# Patient Record
Sex: Female | Born: 1995 | Race: Black or African American | Hispanic: No | Marital: Single | Smoking: Never smoker
Health system: Southern US, Community
[De-identification: ages and names within clinical notes are randomized; demographics above are authoritative.]

## PROBLEM LIST (undated history)

## (undated) DIAGNOSIS — F419 Anxiety disorder, unspecified: Secondary | ICD-10-CM

## (undated) DIAGNOSIS — J45909 Unspecified asthma, uncomplicated: Secondary | ICD-10-CM

## (undated) DIAGNOSIS — E119 Type 2 diabetes mellitus without complications: Secondary | ICD-10-CM

---
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
## 2016-02-01 ENCOUNTER — Emergency Department (INDEPENDENT_AMBULATORY_CARE_PROVIDER_SITE_OTHER)
Admission: EM | Admit: 2016-02-01 | Discharge: 2016-02-01 | Disposition: A | Discharge status: Home / Self Care | Payer: Medicaid Other | Source: Home / Self Care | Attending: Emergency Medicine | Admitting: Emergency Medicine

## 2016-02-01 ENCOUNTER — Encounter (HOSPITAL_COMMUNITY): Payer: Self-pay | Admitting: *Deleted

## 2016-02-01 DIAGNOSIS — R059 Cough, unspecified: Secondary | ICD-10-CM

## 2016-02-01 DIAGNOSIS — R05 Cough: Secondary | ICD-10-CM

## 2016-02-01 NOTE — ED Notes (Signed)
Pt  Reports     Symptoms  Of  Cough      Congested        Nausea  Stuffy  Nose   With  Onset  Of  Symptoms      X  3  Days        Pt  Reports  Symptoms  Not  releived  By  otc  meds

## 2016-02-01 NOTE — Discharge Instructions (Signed)

## 2016-02-01 NOTE — ED Provider Notes (Signed)
CSN: [id number]     Arrival date & time 02/01/16  1303 History   First MD Initiated Contact with Patient 02/01/16 1439     Chief Complaint  Patient presents with  . URI   (Consider location/radiation/quality/duration/timing/severity/associated sxs/prior Treatment) HPI Cough for the last few days, dry minimally productive. Not sleeping well at night.  No fever at home.  History reviewed. No pertinent past medical history. History reviewed. No pertinent past surgical history. Family History  Problem Relation Age of Onset  . Diabetes Other   . Hypertension Other    Social History  Substance Use Topics  . Smoking status: Never Smoker   . Smokeless tobacco: None  . Alcohol Use: No   OB History    No data available     Review of Systems Cough without fever, diarrhea, chills Allergies  Review of patient's allergies indicates no known allergies.  Home Medications   Prior to Admission medications   Medication Sig Start Date End Date Taking? Authorizing Provider  cyclobenzaprine (FLEXERIL) 10 MG tablet Take 1 tablet (10 mg total) by mouth 2 (two) times daily as needed for muscle spasms. 09/23/15   Chase PicketJaime Pilcher Ward, PA-C  naproxen (NAPROSYN) 500 MG tablet Take 1 tablet (500 mg total) by mouth 2 (two) times daily with a meal. 09/23/15   Chase PicketJaime Pilcher Ward, PA-C   Meds Ordered and Administered this Visit  Medications - No data to display  BP 130/69 mmHg  Pulse 78  Temp(Src) 98.2 F (36.8 C) (Oral)  Resp 16  SpO2 100% No data found.   Physical Exam NURSES NOTES AND VITAL SIGNS REVIEWED. CONSTITUTIONAL: Well developed, well nourished, no acute distress HEENT: normocephalic, atraumatic, right and left TM's are normal EYES: Conjunctiva normal NECK:normal ROM, supple, no adenopathy PULMONARY:No respiratory distress, normal effort, Lungs: CTAb/l, no wheezes, or increased work of breathing CARDIOVASCULAR: RRR, no murmur ABDOMEN: soft, ND, NT, +'ve BS MUSCULOSKELETAL: Normal  ROM of all extremities,  SKIN: warm and dry without rash PSYCHIATRIC: Mood and affect, behavior are normal  ED Course  Procedures (including critical care time)  Labs Review Labs Reviewed - No data to display  Imaging Review No results found.   Visual Acuity Review  Right Eye Distance:   Left Eye Distance:   Bilateral Distance:    Right Eye Near:   Left Eye Near:    Bilateral Near:         MDM   1. Cough      Patient is reassured that there are no issues that require transfer to higher level of care at this time or additional tests. Patient is advised to continue home symptomatic treatment. Patient is advised that if there are new or worsening symptoms to attend the emergency department, contact primary care provider, or return to UC. Instructions of care provided discharged home in stable condition. Return to work/school note provided.   THIS NOTE WAS GENERATED USING A VOICE RECOGNITION SOFTWARE PROGRAM. ALL REASONABLE EFFORTS  WERE MADE TO PROOFREAD THIS DOCUMENT FOR ACCURACY.  I have verbally reviewed the discharge instructions with the patient. A printed AVS was given to the patient.  All questions were answered prior to discharge.      Tharon AquasFrank C Patrick, PA 02/01/16 2210

## 2016-05-11 ENCOUNTER — Encounter (HOSPITAL_COMMUNITY): Payer: Self-pay | Admitting: *Deleted

## 2016-05-11 ENCOUNTER — Emergency Department (HOSPITAL_COMMUNITY)
Admission: EM | Admit: 2016-05-11 | Discharge: 2016-05-11 | Disposition: A | Discharge status: Home / Self Care | Payer: Medicaid Other | Attending: Emergency Medicine | Admitting: Emergency Medicine

## 2016-05-11 DIAGNOSIS — Y929 Unspecified place or not applicable: Secondary | ICD-10-CM | POA: Diagnosis not present

## 2016-05-11 DIAGNOSIS — Y9367 Activity, basketball: Secondary | ICD-10-CM | POA: Insufficient documentation

## 2016-05-11 DIAGNOSIS — M79604 Pain in right leg: Secondary | ICD-10-CM | POA: Diagnosis present

## 2016-05-11 DIAGNOSIS — S76311A Strain of muscle, fascia and tendon of the posterior muscle group at thigh level, right thigh, initial encounter: Secondary | ICD-10-CM | POA: Diagnosis not present

## 2016-05-11 DIAGNOSIS — J45909 Unspecified asthma, uncomplicated: Secondary | ICD-10-CM | POA: Insufficient documentation

## 2016-05-11 DIAGNOSIS — X501XXA Overexertion from prolonged static or awkward postures, initial encounter: Secondary | ICD-10-CM | POA: Insufficient documentation

## 2016-05-11 DIAGNOSIS — Y999 Unspecified external cause status: Secondary | ICD-10-CM | POA: Diagnosis not present

## 2016-05-11 DIAGNOSIS — Z791 Long term (current) use of non-steroidal anti-inflammatories (NSAID): Secondary | ICD-10-CM | POA: Diagnosis not present

## 2016-05-11 HISTORY — DX: Unspecified asthma, uncomplicated: J45.909

## 2016-05-11 NOTE — ED Notes (Signed)
Pt given discharge instructions verbalized understanding of POC to follow up, reasons to return to the ED and medications to take at home for continued pain. Pt denied further questions, discharged with family. Pt escorted via wheelchair without difficulty.

## 2016-05-11 NOTE — ED Notes (Signed)
Pt said she fell while playing basketball yesterday and c/o pain in the back of her right thigh. Took ibuprofen without relief.

## 2016-05-11 NOTE — Discharge Instructions (Signed)
Hamstring Strain A hamstring strain is an injury that occurs when the hamstring muscles are overstretched or overloaded. The hamstring muscles are a group of muscles at the back of the thighs. These muscles are used in straightening the hips, bending the knees, and pulling back the legs. This type of injury is often called a pulled hamstring muscle. The severity of a muscle strain is rated in degrees. First-degree strains have the least amount of muscle fiber tearing and pain. Second-degree and third-degree strains have increasingly more tearing and pain. CAUSES Hamstring strains occur when a sudden, violent force is placed on these muscles and stretches them too far. This often occurs during activities that involve running, jumping, kicking, or weight lifting. RISK FACTORS Hamstring strains are especially common in athletes. Other things that can increase your risk for this injury include:  Having low strength, endurance, or flexibility of the hamstring muscles.  Performing high-impact physical activity.  Having poor physical fitness.  Having a previous leg injury.  Having fatigued muscles.  Older age. SIGNS AND SYMPTOMS  Pain in the back of the thigh.  Bruising.  Swelling.  Muscle spasm.  Difficulty using the muscle because of pain or lack of normal function. For severe strains, you may have a popping or snapping feeling when the injury occurs. DIAGNOSIS Your health care provider will perform a physical exam and ask about your medical history.  TREATMENT Often, the best treatment for a hamstring strain is protecting, resting, icing, applying compression, and elevating the injured area. This is referred to as the PRICE method of treatment. Your health care provider may also recommend medicines to help reduce pain or inflammation. HOME CARE INSTRUCTIONS  Use the PRICE method of treatment to promote muscle healing during the first 2-3 days after your injury. The PRICE method  involves:  P--Protecting the muscle from being injured again.  R--Restricting your activity and resting the injured body part.  I--Icing your injury. To do this, put ice in a plastic bag. Place a towel between your skin and the bag. Then, apply the ice and leave it on for 20 minutes, 2-3 times per day. After the third day, switch to moist heat packs.  C--Applying compression to the injured area with an elastic bandage. Be careful not to wrap it too tightly. That may interfere with blood circulation or may increase swelling.  E--Elevating the injured body part above the level of your heart as often as you can. You can do this by putting a pillow under your thigh when you sit or lie down.  Take medicines only as directed by your health care provider.  Begin exercising or stretching as directed by your health care provider.  Do not return to full activity level until your health care provider approves.  Keep all follow-up visits as directed by your health care provider. This is important. SEEK MEDICAL CARE IF:  You have increasing pain or swelling in the injured area.  You have numbness, tingling, or a significant loss of strength in the injured area.  Your foot or your toes become cold or turn blue.   This information is not intended to replace advice given to you by your health care provider. Make sure you discuss any questions you have with your health care provider.   Apply ice to affected area. Take ibuprofen every 4-6 hours. Rest your muscle as much as possible, avoid strenuous activity until your hamstring heals. Follow up with your primary care provider if you experience severe worsening  of your symptoms, increased pain or swelling, bulging of your muscle, blue discoloration of your extremity, fevers, chest pain or shortness of breath.

## 2016-05-13 NOTE — ED Provider Notes (Signed)
CSN: [id number]     Arrival date & time 05/11/16  2120 History   First MD Initiated Contact with Patient 05/11/16 2221     Chief Complaint  Patient presents with  . Leg Pain     (Consider location/radiation/quality/duration/timing/severity/associated sxs/prior Treatment) HPI  Barbara Cabrera is an 20 y.o F with no significant pmhx who presents to the ED today c.o R leg pain. Pt states that she was playing basketball with her brother yesterday and he fell on her causing her to also fall down. Pt states that her right leg twisted and she felt pain in her right hamstring. Pain is worsened with walking and movement. Pt has been able to ambulate without difficulty. Pt has tried icing it and taking ibuprofen which provides some relief but the pain is not completely gone. Pt states her high school trainer told her she may have strained her hamstring. She denies any swelling, bulging or discoloration of her leg. She is not on OCPs.  Past Medical History  Diagnosis Date  . Asthma    History reviewed. No pertinent past surgical history. Family History  Problem Relation Age of Onset  . Diabetes Other   . Hypertension Other    Social History  Substance Use Topics  . Smoking status: Never Smoker   . Smokeless tobacco: None  . Alcohol Use: No   OB History    No data available     Review of Systems  All other systems reviewed and are negative.     Allergies  Review of patient's allergies indicates no known allergies.  Home Medications   Prior to Admission medications   Medication Sig Start Date End Date Taking? Authorizing Provider  cyclobenzaprine (FLEXERIL) 10 MG tablet Take 1 tablet (10 mg total) by mouth 2 (two) times daily as needed for muscle spasms. 09/23/15   Chase PicketJaime Pilcher Ward, PA-C  naproxen (NAPROSYN) 500 MG tablet Take 1 tablet (500 mg total) by mouth 2 (two) times daily with a meal. 09/23/15   Jaime Pilcher Ward, PA-C   BP 120/72 mmHg  Pulse 73  Temp(Src) 98.9 F (37.2  C) (Oral)  Resp 16  Ht 5\' 9"  (1.753 m)  Wt 86.183 kg  BMI 28.05 kg/m2  SpO2 100%  LMP 04/21/2016 Physical Exam  Constitutional: She is oriented to person, place, and time. She appears well-developed and well-nourished. No distress.  HENT:  Head: Normocephalic and atraumatic.  Eyes: Conjunctivae are normal. Right eye exhibits no discharge. Left eye exhibits no discharge. No scleral icterus.  Cardiovascular: Normal rate.   Pulmonary/Chest: Effort normal.  Musculoskeletal:  Mild TTP of right hamstring. No bulging of muscle with flexion. No swelling.   Neurological: She is alert and oriented to person, place, and time. Coordination normal.  Skin: Skin is warm and dry. No rash noted. She is not diaphoretic. No erythema. No pallor.  Psychiatric: She has a normal mood and affect. Her behavior is normal.  Nursing note and vitals reviewed.   ED Course  Procedures (including critical care time) Labs Review Labs Reviewed - No data to display  Imaging Review No results found. I have personally reviewed and evaluated these images and lab results as part of my medical decision-making.   EKG Interpretation None      MDM   Final diagnoses:  Hamstring strain, right, initial encounter    Pt presents with mild pain in right hamstring after playing basketball yesterday. No sign of muscle tear or rupture. Likely muscle strain. Pt ambulatory without  difficulty. Recommend RICE precautions, NSAIDs. Follow up with PCP. Discussed treatment plan with pt and parent who are agreeable. Return precautions outlined in patient discharge instructions.      Lester KinsmanSamantha Tripp GormanDowless, PA-C 05/13/16 1530  Marily MemosJason Mesner, MD 05/16/16 [phone]

## 2016-08-23 ENCOUNTER — Encounter (HOSPITAL_COMMUNITY): Payer: Self-pay | Admitting: Family Medicine

## 2016-08-23 ENCOUNTER — Ambulatory Visit (HOSPITAL_COMMUNITY)
Admission: EM | Admit: 2016-08-23 | Discharge: 2016-08-23 | Disposition: A | Discharge status: Home / Self Care | Payer: Medicaid Other | Attending: Family Medicine | Admitting: Family Medicine

## 2016-08-23 DIAGNOSIS — W57XXXA Bitten or stung by nonvenomous insect and other nonvenomous arthropods, initial encounter: Secondary | ICD-10-CM

## 2016-08-23 DIAGNOSIS — L989 Disorder of the skin and subcutaneous tissue, unspecified: Secondary | ICD-10-CM

## 2016-08-23 NOTE — ED Provider Notes (Signed)
CSN: [id number]     Arrival date & time 08/23/16  1925 History   First MD Initiated Contact with Patient 08/23/16 2046     Chief Complaint  Patient presents with  . Insect Bite   (Consider location/radiation/quality/duration/timing/severity/associated sxs/prior Treatment) 20 year old female concerned about 2 lesions to the left forearm believed to be insect bites occurring 2 days ago. She has had no additional lesions since then. They are itchy. Denies systemic symptoms or swelling. Denies rash.      Past Medical History:  Diagnosis Date  . Asthma    History reviewed. No pertinent surgical history. Family History  Problem Relation Age of Onset  . Diabetes Other   . Hypertension Other    Social History  Substance Use Topics  . Smoking status: Never Smoker  . Smokeless tobacco: Never Used  . Alcohol use No   OB History    No data available     Review of Systems  HENT: Negative.   Respiratory: Negative.   Gastrointestinal: Negative.   Skin:       As per history of present illness  Neurological: Negative.   All other systems reviewed and are negative.   Allergies  Review of patient's allergies indicates no known allergies.  Home Medications   Prior to Admission medications   Medication Sig Start Date End Date Taking? Authorizing Provider  cyclobenzaprine (FLEXERIL) 10 MG tablet Take 1 tablet (10 mg total) by mouth 2 (two) times daily as needed for muscle spasms. 09/23/15   Chase PicketJaime Pilcher Ward, PA-C  naproxen (NAPROSYN) 500 MG tablet Take 1 tablet (500 mg total) by mouth 2 (two) times daily with a meal. 09/23/15   Chase PicketJaime Pilcher Ward, PA-C   Meds Ordered and Administered this Visit  Medications - No data to display  BP 119/60   Pulse 72   Temp 98.2 F (36.8 C)   Resp 18   SpO2 100%  No data found.   Physical Exam  Constitutional: She is oriented to person, place, and time. She appears well-developed and well-nourished. No distress.  HENT:  Nose: Nose  normal.  Mouth/Throat: Oropharynx is clear and moist.  Eyes: EOM are normal.  Neck: Normal range of motion. Neck supple.  Cardiovascular: Normal rate.   Pulmonary/Chest: Effort normal. No respiratory distress.  Musculoskeletal: She exhibits no edema.  Neurological: She is alert and oriented to person, place, and time. She exhibits normal muscle tone.  Skin: Skin is warm and dry.  There are 2 annular erythematous lesions, smooth and slightly raised to the left forearm. One to the distal forearm flexor surface the other to the proximal forearm older aspect. Both are the same size and appeared identical. No bleeding or drainage. No red streaks. No cellulitis. No other lesions.  Psychiatric: She has a normal mood and affect.  Nursing note and vitals reviewed.   Urgent Care Course   Clinical Course    Procedures (including critical care time)  Labs Review Labs Reviewed - No data to display  Imaging Review No results found.   Visual Acuity Review  Right Eye Distance:   Left Eye Distance:   Bilateral Distance:    Right Eye Near:   Left Eye Near:    Bilateral Near:         MDM   1. Insect bite, initial encounter    Discussed with patient this possibly may be bugs that was just 2 lesions and no morning the past 2 days likely not. Apply 1% hydrocortisone cream  and Benadryl cream to the lesions as directed for itching. 4 development of intense itching also use ice locally.     Hayden Rasmussen, NP 08/23/16 [phone]

## 2016-08-23 NOTE — ED Triage Notes (Signed)
Pt here for multiple insect bites to left FA. Denies pain. sts sore.

## 2016-08-23 NOTE — Discharge Instructions (Signed)
Discussed with patient this possibly may be bugs that was just 2 lesions and no morning the past 2 days likely not. Apply 1% hydrocortisone cream and Benadryl cream to the lesions as directed for itching. 4 development of intense itching also use ice locally.

## 2017-05-10 ENCOUNTER — Encounter (HOSPITAL_COMMUNITY): Payer: Self-pay | Admitting: Emergency Medicine

## 2017-05-10 ENCOUNTER — Ambulatory Visit (HOSPITAL_COMMUNITY)
Admission: EM | Admit: 2017-05-10 | Discharge: 2017-05-10 | Disposition: A | Discharge status: Home / Self Care | Payer: Medicaid Other | Attending: Family Medicine | Admitting: Family Medicine

## 2017-05-10 DIAGNOSIS — J4521 Mild intermittent asthma with (acute) exacerbation: Secondary | ICD-10-CM

## 2017-05-10 DIAGNOSIS — R0602 Shortness of breath: Secondary | ICD-10-CM

## 2017-05-10 DIAGNOSIS — R062 Wheezing: Secondary | ICD-10-CM | POA: Diagnosis not present

## 2017-05-10 MED ORDER — ALBUTEROL SULFATE HFA 108 (90 BASE) MCG/ACT IN AERS: 1.0000 | INHALATION_SPRAY | Freq: Four times a day (QID) | RESPIRATORY_TRACT | 0 refills | Status: DC | PRN

## 2017-05-10 MED ORDER — ALBUTEROL SULFATE (2.5 MG/3ML) 0.083% IN NEBU
INHALATION_SOLUTION | RESPIRATORY_TRACT | Status: AC
  Filled 2017-05-10: qty 3

## 2017-05-10 MED ORDER — ALBUTEROL SULFATE (2.5 MG/3ML) 0.083% IN NEBU
2.5000 mg | INHALATION_SOLUTION | Freq: Once | RESPIRATORY_TRACT | Status: AC
  Administered 2017-05-10: 2.5 mg via RESPIRATORY_TRACT

## 2017-05-10 MED ORDER — PREDNISONE 10 MG (21) PO TBPK: ORAL_TABLET | ORAL | 0 refills | Status: DC

## 2017-05-10 MED ORDER — SODIUM CHLORIDE 0.9 % IN NEBU
INHALATION_SOLUTION | RESPIRATORY_TRACT | Status: AC
  Filled 2017-05-10: qty 3

## 2017-05-10 NOTE — ED Provider Notes (Signed)
CSN: [id number]     Arrival date & time 05/10/17  1851 History   None    Chief Complaint  Patient presents with  . Shortness of Breath   (Consider location/radiation/quality/duration/timing/severity/associated sxs/prior Treatment) Patient c/o asthma exacerbation, SOB, and wheezing.   The history is provided by the patient.  Shortness of Breath  Severity:  Moderate Onset quality:  Sudden Duration:  3 days Timing:  Constant Progression:  Worsening Chronicity:  New Relieved by:  Nothing Worsened by:  Nothing Ineffective treatments:  None tried   Past Medical History:  Diagnosis Date  . Asthma    History reviewed. No pertinent surgical history. Family History  Problem Relation Age of Onset  . Diabetes Other   . Hypertension Other    Social History  Substance Use Topics  . Smoking status: Never Smoker  . Smokeless tobacco: Never Used  . Alcohol use No   OB History    No data available     Review of Systems  Constitutional: Negative.   HENT: Negative.   Eyes: Negative.   Respiratory: Positive for shortness of breath.   Cardiovascular: Negative.   Gastrointestinal: Negative.   Endocrine: Negative.   Genitourinary: Negative.   Musculoskeletal: Negative.   Allergic/Immunologic: Negative.   Neurological: Negative.   Hematological: Negative.   Psychiatric/Behavioral: Negative.     Allergies  Patient has no known allergies.  Home Medications   Prior to Admission medications   Medication Sig Start Date End Date Taking? Authorizing Provider  albuterol (PROVENTIL HFA;VENTOLIN HFA) 108 (90 Base) MCG/ACT inhaler Inhale into the lungs every 6 (six) hours as needed for wheezing or shortness of breath.   Yes [provider]  albuterol (PROVENTIL HFA;VENTOLIN HFA) 108 (90 Base) MCG/ACT inhaler Inhale 1-2 puffs into the lungs every 6 (six) hours as needed for wheezing or shortness of breath. 05/10/17   Deatra Canter, FNP  predniSONE (STERAPRED UNI-PAK 21  TAB) 10 MG (21) TBPK tablet Take 6-5-4-3-2-1 po qd 05/10/17   Deatra Canter, FNP   Meds Ordered and Administered this Visit   Medications  albuterol (PROVENTIL) (2.5 MG/3ML) 0.083% nebulizer solution 2.5 mg (2.5 mg Nebulization Given 05/10/17 1931)    BP 140/87 (BP Location: Right Arm)   Pulse 84   Temp 98.3 F (36.8 C) (Oral)   Resp 18   SpO2 100%  No data found.   Physical Exam  Constitutional: She appears well-developed and well-nourished.  HENT:  Head: Normocephalic and atraumatic.  Right Ear: External ear normal.  Left Ear: External ear normal.  Mouth/Throat: Oropharynx is clear and moist.  Eyes: Conjunctivae and EOM are normal. Pupils are equal, round, and reactive to light.  Neck: Normal range of motion. Neck supple.  Cardiovascular: Normal rate, regular rhythm and normal heart sounds.   Pulmonary/Chest: Effort normal. She has wheezes.  Abdominal: Soft. Bowel sounds are normal.  Nursing note and vitals reviewed.   Urgent Care Course     Procedures (including critical care time)  Labs Review Labs Reviewed - No data to display  Imaging Review No results found.   Visual Acuity Review  Right Eye Distance:   Left Eye Distance:   Bilateral Distance:    Right Eye Near:   Left Eye Near:    Bilateral Near:         MDM   1. Mild intermittent asthma with exacerbation   2. SOB (shortness of breath)    Albuterol neb treatment Prednisone 10mg  6-5-4-3-2-1 po qd #21 Albuterol MDI  2 puffs q 4 hours prn       Deatra CanterOxford, Apolinar Bero J, FNP 05/10/17 1943    Deatra CanterOxford, Areonna Bran J, FNP 05/10/17 1954

## 2017-05-10 NOTE — ED Triage Notes (Signed)
[facility] a complaint of SOB and exacerbation of her asthma x 3 days.

## 2017-09-25 ENCOUNTER — Encounter (HOSPITAL_COMMUNITY): Payer: Self-pay | Admitting: *Deleted

## 2017-09-25 ENCOUNTER — Other Ambulatory Visit: Payer: Self-pay

## 2017-09-25 ENCOUNTER — Emergency Department (HOSPITAL_COMMUNITY): Payer: Medicaid Other

## 2017-09-25 ENCOUNTER — Emergency Department (HOSPITAL_COMMUNITY)
Admission: EM | Admit: 2017-09-25 | Discharge: 2017-09-25 | Disposition: A | Discharge status: Home / Self Care | Payer: Medicaid Other | Attending: Emergency Medicine | Admitting: Emergency Medicine

## 2017-09-25 DIAGNOSIS — M25571 Pain in right ankle and joints of right foot: Secondary | ICD-10-CM

## 2017-09-25 DIAGNOSIS — Y999 Unspecified external cause status: Secondary | ICD-10-CM | POA: Diagnosis not present

## 2017-09-25 DIAGNOSIS — Y9389 Activity, other specified: Secondary | ICD-10-CM | POA: Diagnosis not present

## 2017-09-25 DIAGNOSIS — Y9289 Other specified places as the place of occurrence of the external cause: Secondary | ICD-10-CM | POA: Insufficient documentation

## 2017-09-25 DIAGNOSIS — S99811A Other specified injuries of right ankle, initial encounter: Secondary | ICD-10-CM | POA: Diagnosis present

## 2017-09-25 DIAGNOSIS — S93401A Sprain of unspecified ligament of right ankle, initial encounter: Secondary | ICD-10-CM | POA: Insufficient documentation

## 2017-09-25 DIAGNOSIS — X509XXA Other and unspecified overexertion or strenuous movements or postures, initial encounter: Secondary | ICD-10-CM | POA: Diagnosis not present

## 2017-09-25 DIAGNOSIS — J45909 Unspecified asthma, uncomplicated: Secondary | ICD-10-CM | POA: Insufficient documentation

## 2017-09-25 MED ORDER — IBUPROFEN 400 MG PO TABS
600.0000 mg | ORAL_TABLET | Freq: Once | ORAL | Status: AC
  Administered 2017-09-25: 600 mg via ORAL
  Filled 2017-09-25: qty 1

## 2017-09-25 NOTE — ED Provider Notes (Signed)
[facility] EMERGENCY DEPARTMENT Provider Note   CSN: [id number] Arrival date & time: 09/25/17  3086     History   Chief Complaint Chief Complaint  Patient presents with  . Ankle Pain    HPI  Barbara Cabrera is a 21 y.o. Female with history of asthma, presents with pain and swelling to her lateral ankle.  Patient reports she was trying to leave the homecoming concert last night and stepped down and rolled her ankle.  Since then she has had pain and swelling to the lateral aspect of her ankle as well as the top of her foot and has had pain with weightbearing and difficulty walking.  Patient denies any numbness or tingling in the foot.  No pain at the knee.  Patient took an ibuprofen last night which she said helped with the pain, has not tried anything else for her symptoms at home.       Past Medical History:  Diagnosis Date  . Asthma     There are no active problems to display for this patient.   History reviewed. No pertinent surgical history.  OB History    No data available       Home Medications    Prior to Admission medications   Medication Sig Start Date End Date Taking? Authorizing Provider  albuterol (PROVENTIL HFA;VENTOLIN HFA) 108 (90 Base) MCG/ACT inhaler Inhale into the lungs every 6 (six) hours as needed for wheezing or shortness of breath.    [provider]  albuterol (PROVENTIL HFA;VENTOLIN HFA) 108 (90 Base) MCG/ACT inhaler Inhale 1-2 puffs into the lungs every 6 (six) hours as needed for wheezing or shortness of breath. 05/10/17   Deatra Canter, FNP  predniSONE (STERAPRED UNI-PAK 21 TAB) 10 MG (21) TBPK tablet Take 6-5-4-3-2-1 po qd 05/10/17   Deatra Canter, FNP    Family History Family History  Problem Relation Age of Onset  . Diabetes Other   . Hypertension Other     Social History Social History   Tobacco Use  . Smoking status: Never Smoker  . Smokeless tobacco: Never Used  Substance Use Topics  .  Alcohol use: No  . Drug use: No     Allergies   Patient has no known allergies.   Review of Systems Review of Systems  Constitutional: Negative for chills and fever.  Musculoskeletal: Positive for arthralgias (R ankle) and joint swelling.  Skin: Negative for color change and wound.  Neurological: Negative for weakness and numbness.     Physical Exam Updated Vital Signs BP 117/69 (BP Location: Left Arm)   Pulse 72   Temp 98.3 F (36.8 C) (Oral)   Resp 17   Ht 5\' 7"  (1.702 m)   Wt 76.7 kg (169 lb)   LMP 09/21/2017   SpO2 98%   BMI 26.47 kg/m   Physical Exam  Constitutional: She appears well-developed and well-nourished. No distress.  HENT:  Head: Normocephalic and atraumatic.  Eyes: Right eye exhibits no discharge. Left eye exhibits no discharge.  Pulmonary/Chest: Effort normal. No respiratory distress.  Musculoskeletal:  Swelling and tenderness over the lateral aspect of the ankle, patient also has just proximal to the great toe MTP joint, patient able to wiggle all toes, ankle range of motion limited by pain, no tenderness at the knee, distal pulses 2+ and good capillary refill, sensation intact  Neurological: She is alert. Coordination normal.  Skin: She is not diaphoretic.  Psychiatric: She has a normal mood and affect.  Her behavior is normal.  Nursing note and vitals reviewed.    ED Treatments / Results  Labs (all labs ordered are listed, but only abnormal results are displayed) Labs Reviewed - No data to display  EKG  EKG Interpretation None       Radiology Dg Ankle Complete Right  Result Date: 09/25/2017 CLINICAL DATA:  Acute right ankle pain after injury yesterday. EXAM: RIGHT ANKLE - COMPLETE 3+ VIEW COMPARISON:  Radiographs of March 05, 2014. FINDINGS: There is no evidence of fracture, dislocation, or joint effusion. There is no evidence of arthropathy or other focal bone abnormality. Soft tissues are unremarkable. IMPRESSION: Normal right ankle.  Electronically Signed   By: Lupita RaiderJames  Green Jr, M.D.   On: 09/25/2017 11:43   Dg Foot Complete Right  Result Date: 09/25/2017 CLINICAL DATA:  Right foot pain after injury yesterday on stairs. EXAM: RIGHT FOOT COMPLETE - 3+ VIEW COMPARISON:  Radiographs of August 15, 2013. FINDINGS: There is no evidence of fracture or dislocation. There is no evidence of arthropathy or other focal bone abnormality. Soft tissues are unremarkable. IMPRESSION: Normal right foot. Electronically Signed   By: Lupita RaiderJames  Green Jr, M.D.   On: 09/25/2017 11:45    Procedures Procedures (including critical care time)  Medications Ordered in ED Medications  ibuprofen (ADVIL,MOTRIN) tablet 600 mg (600 mg Oral Given 09/25/17 1154)     Initial Impression / Assessment and Plan / ED Course  I have reviewed the triage vital signs and the nursing notes.  Pertinent labs & imaging results that were available during my care of the patient were reviewed by me and considered in my medical decision making (see chart for details).  Presentation consistent with ankle sprain. Tenderness and swelling over medial malleolus, pt is neurovascularly intact, and x-ray negative for fracture, and shows ankle mortise is intact. Pain treated in the ED. Pt placed in ASO brace and provided crutches, ambulated without difficulty. Pt stable for discharge home with ibuprofen for pain. Pt to follow-up with ortho in one week if symptoms not improving. Return precautions discussed, Pt expresses understanding and agrees with plan.   Final Clinical Impressions(s) / ED Diagnoses   Final diagnoses:  Sprain of right ankle, unspecified ligament, initial encounter  Acute right ankle pain    ED Discharge Orders    None       Dartha LodgeFord, Amir Fick N, PA-C 09/25/17 1939    Melene PlanFloyd, Dan, DO 09/26/17 [phone]

## 2017-09-25 NOTE — ED Notes (Signed)
Pt in xray

## 2017-09-25 NOTE — ED Triage Notes (Signed)
Pt was at concert on yesterday and stepped down wrong and landed on right ankle.  Pt has pain and swelling

## 2017-09-25 NOTE — Discharge Instructions (Signed)
X-ray shows no fracture.  You can use ibuprofen and Tylenol for pain.  Keep ankle elevated, use brace for support and crutches to stay off of the foot, use ice as well.  If symptoms are not improving after 1 week please follow-up with orthopedics.  If you have worsening pain, fevers, warmth or redness of the ankle please return to the emergency department otherwise please follow-up with your family doctor.

## 2017-09-25 NOTE — ED Notes (Signed)
Pt stable, ambulatory, states understanding of discharge instructions 

## 2017-09-25 NOTE — ED Notes (Signed)
Ortho tech on the way.  

## 2017-09-30 ENCOUNTER — Other Ambulatory Visit: Payer: Self-pay

## 2017-09-30 ENCOUNTER — Encounter (HOSPITAL_COMMUNITY): Payer: Self-pay | Admitting: Emergency Medicine

## 2017-09-30 ENCOUNTER — Emergency Department (HOSPITAL_COMMUNITY): Payer: Medicaid Other

## 2017-09-30 ENCOUNTER — Emergency Department (HOSPITAL_COMMUNITY)
Admission: EM | Admit: 2017-09-30 | Discharge: 2017-09-30 | Disposition: A | Discharge status: Home / Self Care | Payer: Medicaid Other | Attending: Emergency Medicine | Admitting: Emergency Medicine

## 2017-09-30 DIAGNOSIS — Y929 Unspecified place or not applicable: Secondary | ICD-10-CM | POA: Insufficient documentation

## 2017-09-30 DIAGNOSIS — W109XXA Fall (on) (from) unspecified stairs and steps, initial encounter: Secondary | ICD-10-CM | POA: Insufficient documentation

## 2017-09-30 DIAGNOSIS — S9031XA Contusion of right foot, initial encounter: Secondary | ICD-10-CM | POA: Insufficient documentation

## 2017-09-30 DIAGNOSIS — Y999 Unspecified external cause status: Secondary | ICD-10-CM | POA: Insufficient documentation

## 2017-09-30 DIAGNOSIS — S93401A Sprain of unspecified ligament of right ankle, initial encounter: Secondary | ICD-10-CM | POA: Diagnosis not present

## 2017-09-30 DIAGNOSIS — Y9301 Activity, walking, marching and hiking: Secondary | ICD-10-CM | POA: Diagnosis not present

## 2017-09-30 DIAGNOSIS — J45909 Unspecified asthma, uncomplicated: Secondary | ICD-10-CM | POA: Insufficient documentation

## 2017-09-30 DIAGNOSIS — S99911A Unspecified injury of right ankle, initial encounter: Secondary | ICD-10-CM | POA: Diagnosis present

## 2017-09-30 NOTE — ED Triage Notes (Signed)
Pt was seen Monday for ankle injury when she landed wrong while stepping down a stair. Pt diagnosed with ankle sprain. Pt states Wednesday a suit case fell on the same ankle. Pt states pain is worse after the second injury. Arrives with her ankle brace in place. Taking motrin for pain with some relief. Pt states unable to bare weight.

## 2017-09-30 NOTE — ED Provider Notes (Signed)
[facility] EMERGENCY DEPARTMENT Provider Note   CSN: [id number] Arrival date & time: 09/30/17  1122     History   Chief Complaint Chief Complaint  Patient presents with  . Ankle Pain    HPI  Blood pressure 132/85, pulse 79, temperature 98.2 F (36.8 C), resp. rate 18, height 5\' 8"  (1.727 m), weight 72.6 kg (160 lb), last menstrual period 09/21/2017, SpO2 100 %.  Elisabeth CaraDashona Fleig is a 21 y.o. female complaining of foot, she sprained it approximately 1 week ago and then she dropped a book bag on it 3 days ago.  She has been using crutches, icing and using ibuprofen at home with little relief.  She states that it is very difficult to weight-bear on it because it exacerbates the pain.  Past Medical History:  Diagnosis Date  . Asthma     There are no active problems to display for this patient.   No past surgical history on file.  OB History    No data available       Home Medications    Prior to Admission medications   Medication Sig Start Date End Date Taking? Authorizing Provider  albuterol (PROVENTIL HFA;VENTOLIN HFA) 108 (90 Base) MCG/ACT inhaler Inhale into the lungs every 6 (six) hours as needed for wheezing or shortness of breath.    [provider]  albuterol (PROVENTIL HFA;VENTOLIN HFA) 108 (90 Base) MCG/ACT inhaler Inhale 1-2 puffs into the lungs every 6 (six) hours as needed for wheezing or shortness of breath. 05/10/17   Deatra Canterxford, William J, FNP  predniSONE (STERAPRED UNI-PAK 21 TAB) 10 MG (21) TBPK tablet Take 6-5-4-3-2-1 po qd 05/10/17   Deatra Canterxford, William J, FNP    Family History Family History  Problem Relation Age of Onset  . Diabetes Other   . Hypertension Other     Social History Social History   Tobacco Use  . Smoking status: Never Smoker  . Smokeless tobacco: Never Used  Substance Use Topics  . Alcohol use: No  . Drug use: No     Allergies   Patient has no known allergies.   Review of Systems Review of  Systems  A complete review of systems was obtained and all systems are negative except as noted in the HPI and PMH.    Physical Exam Updated Vital Signs BP 132/85   Pulse 79   Temp 98.2 F (36.8 C)   Resp 18   Ht 5\' 8"  (1.727 m)   Wt 72.6 kg (160 lb)   LMP 09/21/2017   SpO2 100%   BMI 24.33 kg/m   Physical Exam  Constitutional: She is oriented to person, place, and time. She appears well-developed and well-nourished. No distress.  HENT:  Head: Normocephalic and atraumatic.  Mouth/Throat: Oropharynx is clear and moist.  Eyes: Conjunctivae and EOM are normal. Pupils are equal, round, and reactive to light.  Neck: Normal range of motion.  Cardiovascular: Normal rate, regular rhythm and intact distal pulses.  Pulmonary/Chest: Effort normal and breath sounds normal.  Abdominal: Soft. There is no tenderness.  Musculoskeletal: Normal range of motion.  Right ankle and foot with no deformity, no ecchymoses, no focal bony tenderness to palpation.  Distally neurovascularly intact.  Neurological: She is alert and oriented to person, place, and time.  Skin: She is not diaphoretic.  Psychiatric: She has a normal mood and affect.  Nursing note and vitals reviewed.    ED Treatments / Results  Labs (all labs ordered are listed, but  only abnormal results are displayed) Labs Reviewed - No data to display  EKG  EKG Interpretation None       Radiology Dg Ankle Complete Right  Result Date: 09/30/2017 CLINICAL DATA:  Pain after several recent injuries EXAM: RIGHT ANKLE - COMPLETE 3+ VIEW COMPARISON:  September 25, 2017 FINDINGS: Frontal, oblique, and lateral views were obtained. There is no fracture or joint effusion. There is no appreciable joint space narrowing or erosion. The ankle mortise appears intact. IMPRESSION: No fracture or evident arthropathy.  Ankle mortise appears intact. Electronically Signed   By: Bretta BangWilliam  Woodruff III M.D.   On: 09/30/2017 12:19   Dg Foot Complete  Right  Result Date: 09/30/2017 CLINICAL DATA:  Pain following several recent injuries EXAM: RIGHT FOOT COMPLETE - 3+ VIEW COMPARISON:  September 25, 2017 frontal, oblique, and lateral views were obtained. No fracture or dislocation. Joint spaces appear normal. No erosive change. FINDINGS: There is no evidence of fracture or dislocation. There is no evidence of arthropathy or other focal bone abnormality. Soft tissues are unremarkable. IMPRESSION: No fracture or dislocation.  No evident arthropathy. Electronically Signed   By: Bretta BangWilliam  Woodruff III M.D.   On: 09/30/2017 12:20    Procedures Procedures (including critical care time)  Medications Ordered in ED Medications - No data to display   Initial Impression / Assessment and Plan / ED Course  I have reviewed the triage vital signs and the nursing notes.  Pertinent labs & imaging results that were available during my care of the patient were reviewed by me and considered in my medical decision making (see chart for details).     Vitals:   09/30/17 1133 09/30/17 1134  BP: 132/85   Pulse: 79   Resp: 18   Temp: 98.2 F (36.8 C)   SpO2: 100%   Weight:  72.6 kg (160 lb)  Height:  5\' 8"  (1.727 m)    Elisabeth CaraDashona Smedley is 21 y.o. female presenting with right foot and ankle pain after several minor traumas over the course of the week.  Physical exam reassuring.  X-rays negative.  Patient has crutches and ankle support.  Advised rest, compression and elevation, will follow with primary care for recheck this week.  Evaluation does not show pathology that would require ongoing emergent intervention or inpatient treatment. Pt is hemodynamically stable and mentating appropriately. Discussed findings and plan with patient/guardian, who agrees with care plan. All questions answered. Return precautions discussed and outpatient follow up given.      Final Clinical Impressions(s) / ED Diagnoses   Final diagnoses:  Sprain of right ankle, unspecified  ligament, initial encounter  Contusion of right foot, initial encounter    ED Discharge Orders    None       Kaylyn Limisciotta, Makenze Ellett, PA-C 09/30/17 1256    Gwyneth SproutPlunkett, Whitney, MD 10/01/17 1152

## 2017-09-30 NOTE — Discharge Instructions (Signed)
Rest, Ice intermittently (in the first 24-48 hours), Gentle compression with an Ace wrap, and elevate (Limb above the level of the heart) °  °Take up to 800mg of ibuprofen (that is usually 4 over the counter pills)  3 times a day for 5 days. Take with food. ° °Please follow with your primary care doctor in the next 2 days for a check-up. They must obtain records for further management.  ° °Do not hesitate to return to the Emergency Department for any new, worsening or concerning symptoms.  ° °

## 2018-02-23 ENCOUNTER — Emergency Department (HOSPITAL_COMMUNITY): Payer: Medicaid Other

## 2018-02-23 ENCOUNTER — Encounter (HOSPITAL_COMMUNITY): Payer: Self-pay

## 2018-02-23 ENCOUNTER — Other Ambulatory Visit: Payer: Self-pay

## 2018-02-23 ENCOUNTER — Emergency Department (HOSPITAL_COMMUNITY)
Admission: EM | Admit: 2018-02-23 | Discharge: 2018-02-23 | Disposition: A | Discharge status: Home / Self Care | Payer: Medicaid Other | Attending: Emergency Medicine | Admitting: Emergency Medicine

## 2018-02-23 DIAGNOSIS — Y999 Unspecified external cause status: Secondary | ICD-10-CM | POA: Diagnosis not present

## 2018-02-23 DIAGNOSIS — Z79899 Other long term (current) drug therapy: Secondary | ICD-10-CM | POA: Insufficient documentation

## 2018-02-23 DIAGNOSIS — W19XXXA Unspecified fall, initial encounter: Secondary | ICD-10-CM

## 2018-02-23 DIAGNOSIS — S6991XA Unspecified injury of right wrist, hand and finger(s), initial encounter: Secondary | ICD-10-CM | POA: Diagnosis present

## 2018-02-23 DIAGNOSIS — S63501A Unspecified sprain of right wrist, initial encounter: Secondary | ICD-10-CM | POA: Diagnosis not present

## 2018-02-23 DIAGNOSIS — Y92219 Unspecified school as the place of occurrence of the external cause: Secondary | ICD-10-CM | POA: Insufficient documentation

## 2018-02-23 DIAGNOSIS — W108XXA Fall (on) (from) other stairs and steps, initial encounter: Secondary | ICD-10-CM | POA: Diagnosis not present

## 2018-02-23 DIAGNOSIS — J45909 Unspecified asthma, uncomplicated: Secondary | ICD-10-CM | POA: Insufficient documentation

## 2018-02-23 DIAGNOSIS — Y9301 Activity, walking, marching and hiking: Secondary | ICD-10-CM | POA: Insufficient documentation

## 2018-02-23 DIAGNOSIS — S93401A Sprain of unspecified ligament of right ankle, initial encounter: Secondary | ICD-10-CM | POA: Insufficient documentation

## 2018-02-23 DIAGNOSIS — M546 Pain in thoracic spine: Secondary | ICD-10-CM | POA: Diagnosis not present

## 2018-02-23 DIAGNOSIS — IMO0001 Reserved for inherently not codable concepts without codable children: Secondary | ICD-10-CM

## 2018-02-23 MED ORDER — METHOCARBAMOL 500 MG PO TABS: 500.0000 mg | ORAL_TABLET | Freq: Two times a day (BID) | ORAL | 0 refills | Status: DC

## 2018-02-23 MED ORDER — IBUPROFEN 400 MG PO TABS
400.0000 mg | ORAL_TABLET | Freq: Once | ORAL | Status: AC
  Administered 2018-02-23: 400 mg via ORAL
  Filled 2018-02-23: qty 1

## 2018-02-23 MED ORDER — NAPROXEN 500 MG PO TABS: 500.0000 mg | ORAL_TABLET | Freq: Two times a day (BID) | ORAL | 0 refills | Status: DC

## 2018-02-23 MED ORDER — CYCLOBENZAPRINE HCL 10 MG PO TABS
10.0000 mg | ORAL_TABLET | Freq: Once | ORAL | Status: AC
  Administered 2018-02-23: 10 mg via ORAL
  Filled 2018-02-23: qty 1

## 2018-02-23 NOTE — ED Triage Notes (Signed)
Pt endorses tripping and falling down 5 steps at school this morning. Did not hit head, no loc. Pt endorses mid upper back pain, right wrist pain and right ankle pain now. VSS. Ambulatory.

## 2018-02-23 NOTE — ED Notes (Signed)
Patient transported to X-ray 

## 2018-02-23 NOTE — Progress Notes (Signed)
Orthopedic Tech Progress Note Patient Details:  Barbara CaraDashona Cabrera 12/05/1995 [id number]  Ortho Devices Type of Ortho Device: ASO, Velcro wrist splint Ortho Device/Splint Location: rue/rle Ortho Device/Splint Interventions: Application   Post Interventions Patient Tolerated: Well Instructions Provided: Care of device   [name], [name] 02/23/2018, 4:19 PM

## 2018-02-23 NOTE — ED Provider Notes (Signed)
[facility] Provider Note   CSN: [id number] Arrival date & time: 02/23/18  1334     History   Chief Complaint Chief Complaint  Patient presents with  . Fall    HPI Barbara Cabrera is a 22 y.o. female who presents to the ED s/p fall that occurred this morning. Patient reports she was going down steps and tripped and fell down 5 steps at school this morning. Patient denies head injury or LOC. Patient reports pain in the mid upper back, right wrist and right ankle.   HPI  Past Medical History:  Diagnosis Date  . Asthma     There are no active problems to display for this patient.   History reviewed. No pertinent surgical history.   OB History   None      Home Medications    Prior to Admission medications   Medication Sig Start Date End Date Taking? Authorizing Provider  albuterol (PROVENTIL HFA;VENTOLIN HFA) 108 (90 Base) MCG/ACT inhaler Inhale into the lungs every 6 (six) hours as needed for wheezing or shortness of breath.    [provider]  albuterol (PROVENTIL HFA;VENTOLIN HFA) 108 (90 Base) MCG/ACT inhaler Inhale 1-2 puffs into the lungs every 6 (six) hours as needed for wheezing or shortness of breath. 05/10/17   Deatra Canterxford, William J, FNP  methocarbamol (ROBAXIN) 500 MG tablet Take 1 tablet (500 mg total) by mouth 2 (two) times daily. 02/23/18   Janne NapoleonNeese, Amritha Yorke M, NP  naproxen (NAPROSYN) 500 MG tablet Take 1 tablet (500 mg total) by mouth 2 (two) times daily. 02/23/18   Janne NapoleonNeese, Lataja Newland M, NP  predniSONE (STERAPRED UNI-PAK 21 TAB) 10 MG (21) TBPK tablet Take 6-5-4-3-2-1 po qd 05/10/17   Deatra Canterxford, William J, FNP    Family History Family History  Problem Relation Age of Onset  . Diabetes Other   . Hypertension Other     Social History Social History   Tobacco Use  . Smoking status: Never Smoker  . Smokeless tobacco: Never Used  Substance Use Topics  . Alcohol use: No  . Drug use: No     Allergies   Patient has no known  allergies.   Review of Systems Review of Systems  Constitutional: Negative for diaphoresis.  HENT: Negative.   Eyes: Negative for visual disturbance.  Respiratory: Negative for shortness of breath.   Gastrointestinal: Negative for abdominal pain, nausea and vomiting.  Musculoskeletal: Positive for arthralgias. Negative for neck pain.  Skin: Negative for wound.  Neurological: Negative for syncope and headaches.     Physical Exam Updated Vital Signs BP 126/87 (BP Location: Right Arm)   Pulse 78   Temp 98 F (36.7 C) (Oral)   Resp 16   Ht 5\' 7"  (1.702 m)   Wt 81.6 kg (180 lb)   LMP 01/23/2018 (Exact Date)   SpO2 100%   BMI 28.19 kg/m   Physical Exam  Constitutional: She is oriented to person, place, and time. She appears well-developed and well-nourished. No distress.  HENT:  Head: Normocephalic and atraumatic.  Right Ear: Tympanic membrane normal.  Left Ear: Tympanic membrane normal.  Nose: Nose normal.  Mouth/Throat: Oropharynx is clear and moist.  Eyes: Pupils are equal, round, and reactive to light. Conjunctivae and EOM are normal.  Neck: Normal range of motion. Neck supple. No spinous process tenderness present.  Cardiovascular: Normal rate, regular rhythm and intact distal pulses.  Pulmonary/Chest: Effort normal and breath sounds normal. No respiratory distress.  Abdominal: Soft. There  is no tenderness.  Musculoskeletal:       Right wrist: She exhibits tenderness and bony tenderness. She exhibits normal range of motion, no deformity and no laceration.       Right ankle: She exhibits normal range of motion, no swelling, no laceration and normal pulse. Tenderness. Lateral malleolus tenderness found. Achilles tendon normal.       Thoracic back: She exhibits tenderness, deformity and spasm. She exhibits normal range of motion, no laceration and normal pulse.  Radial pulses 2+, adequate circulation, equal grips.  Neurological: She is alert and oriented to person, place,  and time. No cranial nerve deficit.  Skin: Skin is warm and dry.  Psychiatric: She has a normal mood and affect. Her behavior is normal.  Nursing note and vitals reviewed.    ED Treatments / Results  Labs (all labs ordered are listed, but only abnormal results are displayed) Labs Reviewed  POC URINE PREG, ED  Radiology Dg Thoracic Spine 2 View  Result Date: 02/23/2018 CLINICAL DATA:  Fall EXAM: THORACIC SPINE 2 VIEWS COMPARISON:  01/12/2015 FINDINGS: There is no evidence of thoracic spine fracture. Alignment is normal. No other significant bone abnormalities are identified. IMPRESSION: Negative. Electronically Signed   By: Marlan Palau M.D.   On: 02/23/2018 14:59   Dg Wrist Complete Right  Result Date: 02/23/2018 CLINICAL DATA:  Fall EXAM: RIGHT WRIST - COMPLETE 3+ VIEW COMPARISON:  None. FINDINGS: There is no evidence of fracture or dislocation. There is no evidence of arthropathy or other focal bone abnormality. Soft tissues are unremarkable. IMPRESSION: Negative. Electronically Signed   By: Marlan Palau M.D.   On: 02/23/2018 14:58   Dg Ankle Complete Right  Result Date: 02/23/2018 CLINICAL DATA:  Fall EXAM: RIGHT ANKLE - COMPLETE 3+ VIEW COMPARISON:  09/30/2017 FINDINGS: There is no evidence of fracture, dislocation, or joint effusion. There is no evidence of arthropathy or other focal bone abnormality. Soft tissues are unremarkable. IMPRESSION: Negative. Electronically Signed   By: Marlan Palau M.D.   On: 02/23/2018 15:00    Procedures Procedures (including critical care time)  Medications Ordered in ED Medications  cyclobenzaprine (FLEXERIL) tablet 10 mg (has no administration in time range)  ibuprofen (ADVIL,MOTRIN) tablet 400 mg (has no administration in time range)     Initial Impression / Assessment and Plan / ED Course  I have reviewed the triage vital signs and the nursing notes. 22 y.o. female with back, right wrist and right ankle pain s/p fall stable for d/c  without neuro deficits. ASO, wrist splint given, NSAIDS, muscle relaxer. Return precautions discussed. X-rays without fracture or dislocation.   Final Clinical Impressions(s) / ED Diagnoses   Final diagnoses:  Fall, initial encounter  Wrist sprain, right, initial encounter  First degree ankle sprain, right, initial encounter  Acute midline thoracic back pain    ED Discharge Orders        Ordered    methocarbamol (ROBAXIN) 500 MG tablet  2 times daily     02/23/18 1549    naproxen (NAPROSYN) 500 MG tablet  2 times daily     02/23/18 1549       Damian Leavell [place], [place] 02/23/18 1609    Tegeler, Canary Brim, MD 02/23/18 [phone]

## 2018-02-23 NOTE — ED Notes (Signed)
Pt back from X-ray.  

## 2018-02-23 NOTE — Discharge Instructions (Addendum)
Do not take the muscle relaxer if driving as it will make you sleepy. °

## 2018-05-11 ENCOUNTER — Emergency Department (HOSPITAL_COMMUNITY)
Admission: EM | Admit: 2018-05-11 | Discharge: 2018-05-11 | Disposition: A | Discharge status: Home / Self Care | Payer: Medicaid Other | Attending: Emergency Medicine | Admitting: Emergency Medicine

## 2018-05-11 ENCOUNTER — Encounter (HOSPITAL_COMMUNITY): Payer: Self-pay | Admitting: Emergency Medicine

## 2018-05-11 ENCOUNTER — Emergency Department (HOSPITAL_COMMUNITY): Payer: Medicaid Other

## 2018-05-11 ENCOUNTER — Other Ambulatory Visit: Payer: Self-pay

## 2018-05-11 DIAGNOSIS — Z79899 Other long term (current) drug therapy: Secondary | ICD-10-CM | POA: Insufficient documentation

## 2018-05-11 DIAGNOSIS — J45909 Unspecified asthma, uncomplicated: Secondary | ICD-10-CM | POA: Diagnosis not present

## 2018-05-11 DIAGNOSIS — R0789 Other chest pain: Secondary | ICD-10-CM | POA: Diagnosis present

## 2018-05-11 LAB — CBC
HEMATOCRIT: 39.4 % (ref 36.0–46.0)
Hemoglobin: 12.8 g/dL (ref 12.0–15.0)
MCH: 30 pg (ref 26.0–34.0)
MCHC: 32.5 g/dL (ref 30.0–36.0)
MCV: 92.5 fL (ref 78.0–100.0)
Platelets: 283 10*3/uL (ref 150–400)
RBC: 4.26 MIL/uL (ref 3.87–5.11)
RDW: 12.7 % (ref 11.5–15.5)
WBC: 8.4 10*3/uL (ref 4.0–10.5)

## 2018-05-11 LAB — BASIC METABOLIC PANEL
Anion gap: 8 (ref 5–15)
BUN: 8 mg/dL (ref 6–20)
CHLORIDE: 104 mmol/L (ref 101–111)
CO2: 27 mmol/L (ref 22–32)
Calcium: 9.3 mg/dL (ref 8.9–10.3)
Creatinine, Ser: 0.88 mg/dL (ref 0.44–1.00)
GFR calc non Af Amer: >60 mL/min (ref >60)
GLUCOSE: 120 mg/dL — AB (ref 65–99)
Potassium: 3.9 mmol/L (ref 3.5–5.1)
SODIUM: 139 mmol/L (ref 135–145)

## 2018-05-11 LAB — I-STAT BETA HCG BLOOD, ED (MC, WL, AP ONLY): I-stat hCG, quantitative: <5 m[IU]/mL (ref <5)

## 2018-05-11 LAB — I-STAT TROPONIN, ED: Troponin i, poc: 0 ng/mL (ref 0.00–0.08)

## 2018-05-11 MED ORDER — MONTELUKAST SODIUM 10 MG PO TABS: 10.0000 mg | ORAL_TABLET | Freq: Every day | ORAL | 0 refills | Status: DC

## 2018-05-11 MED ORDER — ALBUTEROL SULFATE (2.5 MG/3ML) 0.083% IN NEBU
5.0000 mg | INHALATION_SOLUTION | Freq: Once | RESPIRATORY_TRACT | Status: AC
  Administered 2018-05-11: 5 mg via RESPIRATORY_TRACT

## 2018-05-11 MED ORDER — ALBUTEROL SULFATE HFA 108 (90 BASE) MCG/ACT IN AERS: 1.0000 | INHALATION_SPRAY | Freq: Four times a day (QID) | RESPIRATORY_TRACT | 1 refills | Status: AC | PRN

## 2018-05-11 MED ORDER — ALBUTEROL SULFATE (2.5 MG/3ML) 0.083% IN NEBU
INHALATION_SOLUTION | RESPIRATORY_TRACT | Status: AC
  Administered 2018-05-11: 5 mg via RESPIRATORY_TRACT
  Filled 2018-05-11: qty 6

## 2018-05-11 MED ORDER — ALBUTEROL SULFATE HFA 108 (90 BASE) MCG/ACT IN AERS
1.0000 | INHALATION_SPRAY | Freq: Once | RESPIRATORY_TRACT | Status: AC
  Administered 2018-05-11: 1 via RESPIRATORY_TRACT
  Filled 2018-05-11: qty 6.7

## 2018-05-11 NOTE — ED Triage Notes (Signed)
Patient to ED c/o chest tightness/discomfort and SOB. Reports hx asthma, is out of inhaler. No cough or fevers. Resp e/u, skin warm/dry.

## 2018-05-11 NOTE — Discharge Instructions (Signed)
Please see the information and instructions below regarding your visit.  Your diagnoses today include:  1. Chest tightness      Tests performed today include: See side panel of your discharge paperwork for testing performed today. Vital signs are listed at the bottom of these instructions.   All of your testing was good today.  Medications prescribed:    Take any prescribed medications only as prescribed, and any over the counter medications only as directed on the packaging.  I refilled your inhaler today.  I also added on a prescription for a medication called montelukast or Singulair.  This works well for patients with asthma and seasonal allergies.  Please take this and follow-up with your primary care provider to discuss if it is working for you.  Home care instructions:  Please follow any educational materials contained in this packet.   Follow-up instructions: Please follow-up with your primary care provider in 2 weeks for further evaluation of your symptoms if they are not completely improved.   Return instructions:  Please return to the Emergency Department if you experience worsening symptoms.  Please return to the emergency department if you develop any shortness of breath, worsening chest pain, or fevers with chest pain and cough, or coughing up blood. Please return if you have any other emergent concerns.  Additional Information:   Your vital signs today were: BP 121/70    Pulse 75    Resp 18    LMP 04/22/2018 (Approximate)    SpO2 100%  If your blood pressure (BP) was elevated on multiple readings during this visit above 130 for the top number or above 80 for the bottom number, please have this repeated by your primary care provider within one month. --------------  Thank you for allowing us to participate in your care today.

## 2018-05-11 NOTE — ED Provider Notes (Signed)
[facility] Provider Note   CSN: [id number] Arrival date & time: 05/11/18  1654     History   Chief Complaint Chief Complaint  Patient presents with  . Chest Pain  . Asthma    HPI Barbara Cabrera is a 22 y.o. female.  HPI  Patient is a [age] year old female with a history of asthma presenting for chest tightness.  Patient reports that she felt that she was wheezing yesterday, and has been without her inhaler for couple months due to not obtaining a refill.  Patient reports her asthma has overall been under control, however with recent weather changes she noticed increased tightness in her chest.  Patient describes his current episode is similar to prior episodes of asthma exacerbation.  Patient reports that she can reproduce the pain on palpation particularly overlying the sternum.  Patient denies any shortness of breath after receiving a breathing treatment in triage.  Patient denies any recent fevers, respiratory illnesses, productive cough, hemoptysis.  Patient denies any estrogen use, history of DVT/PE, recent immobilization, hospitalization, recent surgery, cancer treatment, or lower extremity edema or pain.  Past Medical History:  Diagnosis Date  . Asthma     There are no active problems to display for this patient.   History reviewed. No pertinent surgical history.   OB History   None      Home Medications    Prior to Admission medications   Medication Sig Start Date End Date Taking? Authorizing Provider  albuterol (PROVENTIL HFA;VENTOLIN HFA) 108 (90 Base) MCG/ACT inhaler Inhale 1-2 puffs into the lungs every 6 (six) hours as needed for wheezing or shortness of breath. 05/10/17  Yes Oxford, Anselm PancoastWilliam J, FNP  naproxen (NAPROSYN) 500 MG tablet Take 1 tablet (500 mg total) by mouth 2 (two) times daily. 02/23/18  Yes Neese, Hope M, NP  methocarbamol (ROBAXIN) 500 MG tablet Take 1 tablet (500 mg total) by mouth 2 (two) times  daily. Patient not taking: Reported on 05/11/2018 02/23/18   Janne NapoleonNeese, Hope M, NP  predniSONE (STERAPRED UNI-PAK 21 TAB) 10 MG (21) TBPK tablet Take 6-5-4-3-2-1 po qd Patient not taking: Reported on 05/11/2018 05/10/17   Deatra Canterxford, William J, FNP    Family History Family History  Problem Relation Age of Onset  . Diabetes Other   . Hypertension Other     Social History Social History   Tobacco Use  . Smoking status: Never Smoker  . Smokeless tobacco: Never Used  Substance Use Topics  . Alcohol use: No  . Drug use: No     Allergies   Patient has no known allergies.   Review of Systems Review of Systems  Constitutional: Negative for chills and fever.  HENT: Positive for congestion and rhinorrhea.   Respiratory: Positive for chest tightness and wheezing. Negative for cough and shortness of breath.   Cardiovascular: Negative for chest pain.  Gastrointestinal: Negative for nausea and vomiting.  Musculoskeletal: Negative for neck pain and neck stiffness.  All other systems reviewed and are negative.    Physical Exam Updated Vital Signs BP 116/90   Pulse 95   Resp 18   LMP 04/22/2018 (Approximate)   SpO2 100%   Physical Exam  Constitutional: She appears well-developed and well-nourished. No distress.  HENT:  Head: Normocephalic and atraumatic.  Mouth/Throat: Oropharynx is clear and moist.  Eyes: Pupils are equal, round, and reactive to light. Conjunctivae and EOM are normal.  Neck: Normal range of motion. Neck supple.  Cardiovascular: Normal rate, regular  rhythm, S1 normal and S2 normal.  No murmur heard. Pulses:      Radial pulses are 2+ on the right side, and 2+ on the left side.       Dorsalis pedis pulses are 2+ on the right side, and 2+ on the left side.  No lower extremity edema. Chest tightness reproducible on palpation over sternum.  Pulmonary/Chest: Effort normal and breath sounds normal. She has no wheezes. She has no rales.  Abdominal: Soft. She exhibits no  distension. There is no tenderness. There is no guarding.  Musculoskeletal: Normal range of motion. She exhibits no edema or deformity.  No calf TTP.  Lymphadenopathy:    She has no cervical adenopathy.  Neurological: She is alert.  Cranial nerves grossly intact. Patient moves extremities symmetrically and with good coordination.  Skin: Skin is warm and dry. No rash noted. No erythema.  Psychiatric: She has a normal mood and affect. Her behavior is normal. Judgment and thought content normal.  Nursing note and vitals reviewed.    ED Treatments / Results  Labs (all labs ordered are listed, but only abnormal results are displayed) Labs Reviewed  BASIC METABOLIC PANEL - Abnormal; Notable for the following components:      Result Value   Glucose, Bld 120 (*)    All other components within normal limits  CBC  I-STAT TROPONIN, ED  I-STAT BETA HCG BLOOD, ED ([facility], [facility], [facility] ONLY)    EKG EKG Interpretation  Date/Time:  Friday May 11 2018 16:59:37 EDT Ventricular Rate:  85 PR Interval:  134 QRS Duration: 82 QT Interval:  356 QTC Calculation: 423 R Axis:   60 Text Interpretation:  Normal sinus rhythm Normal ECG Confirmed by Rolland Porter ([id number]) on 05/11/2018 11:40:08 PM   Radiology Dg Chest 2 View  Result Date: 05/11/2018 CLINICAL DATA:  Shortness of breath EXAM: CHEST - 2 VIEW COMPARISON:  01/12/2015 FINDINGS: The heart size and mediastinal contours are within normal limits. Both lungs are clear. The visualized skeletal structures are unremarkable. IMPRESSION: No active cardiopulmonary disease. Electronically Signed   By: Jasmine Pang M.D.   On: 05/11/2018 18:01    Procedures Procedures (including critical care time)  Medications Ordered in ED Medications  albuterol (PROVENTIL HFA;VENTOLIN HFA) 108 (90 Base) MCG/ACT inhaler 1 puff (has no administration in time range)  albuterol (PROVENTIL) (2.5 MG/3ML) 0.083% nebulizer solution 5 mg (5 mg Nebulization Given 05/11/18 1716)      Initial Impression / Assessment and Plan / ED Course  I have reviewed the triage vital signs and the nursing notes.  Pertinent labs & imaging results that were available during my care of the patient were reviewed by me and considered in my medical decision making (see chart for details).     Patient is nontoxic-appearing, non-tachycardic, afebrile, and in no acute distress.  Patient exhibiting normal respiratory exam.  Chest pain is reproducible on palpation, and patient is not having pleuritic chest pain or shortness of breath.  Chest x-ray is clear.  EKG is normal sinus rhythm with no evidence of ischemia, infarction, or arrhythmia, reviewed by me.  Patient has normal lab work with no evidence of anemia or other acute abnormality.  Patient is PERC and Wells negative, therefore doubt pulmonary embolism as cause of patient's chest tightness.  No evidence of infiltrate on chest x-ray.  Given that patient describes this as similar to her prior history of asthma exacerbation, feel that this is most likely cause.  Patient is not wheezing on  my exam, and her tightness has improved after breathing treatment, therefore feel that refilling inhaler is sufficient for treatment.  Also will add montelukast.  Patient was given return precautions for any worsening chest pain, shortness of breath, hemoptysis, fever or productive cough.  Patient is in understanding and agrees with the plan of care.  Final Clinical Impressions(s) / ED Diagnoses   Final diagnoses:  Chest tightness    ED Discharge Orders        Ordered    montelukast (SINGULAIR) 10 MG tablet  Daily at bedtime     05/11/18 2342    albuterol (PROVENTIL HFA;VENTOLIN HFA) 108 (90 Base) MCG/ACT inhaler  Every 6 hours PRN     05/11/18 2342       Elisha Ponder, PA-C 05/11/18 2346    Melene Plan, DO 05/12/18 0405

## 2018-10-03 ENCOUNTER — Ambulatory Visit (HOSPITAL_COMMUNITY)
Admission: EM | Admit: 2018-10-03 | Discharge: 2018-10-03 | Disposition: A | Discharge status: Home / Self Care | Payer: Self-pay | Attending: Family Medicine | Admitting: Family Medicine

## 2018-10-03 ENCOUNTER — Encounter (HOSPITAL_COMMUNITY): Payer: Self-pay

## 2018-10-03 ENCOUNTER — Other Ambulatory Visit: Payer: Self-pay

## 2018-10-03 DIAGNOSIS — J4521 Mild intermittent asthma with (acute) exacerbation: Secondary | ICD-10-CM

## 2018-10-03 DIAGNOSIS — R05 Cough: Secondary | ICD-10-CM

## 2018-10-03 MED ORDER — PREDNISONE 50 MG PO TABS: 50.0000 mg | ORAL_TABLET | Freq: Every day | ORAL | 0 refills | Status: DC

## 2018-10-03 MED ORDER — IPRATROPIUM-ALBUTEROL 0.5-2.5 (3) MG/3ML IN SOLN
3.0000 mL | Freq: Once | RESPIRATORY_TRACT | Status: AC
  Administered 2018-10-03: 3 mL via RESPIRATORY_TRACT

## 2018-10-03 MED ORDER — CETIRIZINE HCL 10 MG PO TABS: 10.0000 mg | ORAL_TABLET | Freq: Every day | ORAL | 0 refills | Status: DC

## 2018-10-03 MED ORDER — IPRATROPIUM-ALBUTEROL 0.5-2.5 (3) MG/3ML IN SOLN
RESPIRATORY_TRACT | Status: AC
  Filled 2018-10-03: qty 3

## 2018-10-03 NOTE — ED Provider Notes (Signed)
[facility]    CSN: [id number] Arrival date & time: 10/03/18  1131     History   Chief Complaint Chief Complaint  Patient presents with  . Asthma    HPI Barbara Cabrera is a 22 y.o. female.   22 year old female comes in for few day history of asthma exacerbation.  She also had rhinorrhea, nasal congestion.  Mild cough that is nonproductive.  Denies sore throat.  Denies fever, chills, night sweats.  Has been using albuterol with temporary relief.  Never smoker.     Past Medical History:  Diagnosis Date  . Asthma     There are no active problems to display for this patient.   History reviewed. No pertinent surgical history.  OB History   None      Home Medications    Prior to Admission medications   Medication Sig Start Date End Date Taking? Authorizing Provider  albuterol (PROVENTIL HFA;VENTOLIN HFA) 108 (90 Base) MCG/ACT inhaler Inhale 1-2 puffs into the lungs every 6 (six) hours as needed for wheezing or shortness of breath. 05/11/18   Aviva KluverMurray, Alyssa B, PA-C  cetirizine (ZYRTEC) 10 MG tablet Take 1 tablet (10 mg total) by mouth daily. 10/03/18   Cathie HoopsYu, Mitali Shenefield V, PA-C  methocarbamol (ROBAXIN) 500 MG tablet Take 1 tablet (500 mg total) by mouth 2 (two) times daily. Patient not taking: Reported on 05/11/2018 02/23/18   Janne NapoleonNeese, Hope M, NP  montelukast (SINGULAIR) 10 MG tablet Take 1 tablet (10 mg total) by mouth at bedtime. 05/11/18   Aviva KluverMurray, Alyssa B, PA-C  naproxen (NAPROSYN) 500 MG tablet Take 1 tablet (500 mg total) by mouth 2 (two) times daily. 02/23/18   Janne NapoleonNeese, Hope M, NP  predniSONE (DELTASONE) 50 MG tablet Take 1 tablet (50 mg total) by mouth daily. 10/03/18   Belinda FisherYu, Jaimy Kliethermes V, PA-C    Family History Family History  Problem Relation Age of Onset  . Diabetes Other   . Hypertension Other     Social History Social History   Tobacco Use  . Smoking status: Never Smoker  . Smokeless tobacco: Never Used  Substance Use Topics  . Alcohol use: No  . Drug use: No      Allergies   Patient has no known allergies.   Review of Systems Review of Systems  Reason unable to perform ROS: See HPI as above.     Physical Exam Triage Vital Signs ED Triage Vitals  Enc Vitals Group     BP 10/03/18 1215 121/70     Pulse Rate 10/03/18 1215 80     Resp 10/03/18 1215 18     Temp 10/03/18 1215 98.7 F (37.1 C)     Temp Source 10/03/18 1215 Oral     SpO2 10/03/18 1215 100 %     Weight 10/03/18 1214 180 lb (81.6 kg)     Height --      Head Circumference --      Peak Flow --      Pain Score 10/03/18 1214 6     Pain Loc --      Pain Edu? --      Excl. in GC? --    No data found.  Updated Vital Signs BP 121/70 (BP Location: Right Arm)   Pulse 80   Temp 98.7 F (37.1 C) (Oral)   Resp 18   Wt 180 lb (81.6 kg)   LMP 10/01/2018   SpO2 100%   BMI 28.19 kg/m   Visual Acuity Right  Eye Distance:   Left Eye Distance:   Bilateral Distance:    Right Eye Near:   Left Eye Near:    Bilateral Near:     Physical Exam  Constitutional: She is oriented to person, place, and time. She appears well-developed and well-nourished. No distress.  HENT:  Head: Normocephalic and atraumatic.  Right Ear: Tympanic membrane, external ear and ear canal normal. Tympanic membrane is not erythematous and not bulging.  Left Ear: Tympanic membrane, external ear and ear canal normal. Tympanic membrane is not erythematous and not bulging.  Nose: Nose normal. Right sinus exhibits no maxillary sinus tenderness and no frontal sinus tenderness. Left sinus exhibits no maxillary sinus tenderness and no frontal sinus tenderness.  Mouth/Throat: Uvula is midline, oropharynx is clear and moist and mucous membranes are normal.  Eyes: Pupils are equal, round, and reactive to light. Conjunctivae are normal.  Neck: Normal range of motion. Neck supple.  Cardiovascular: Normal rate, regular rhythm and normal heart sounds. Exam reveals no gallop and no friction rub.  No murmur  heard. Pulmonary/Chest: Effort normal. No accessory muscle usage. No respiratory distress.  Lungs clear to auscultation bilaterally without adventitious lung sounds, mild decrease in air movement.  Lymphadenopathy:    She has no cervical adenopathy.  Neurological: She is alert and oriented to person, place, and time.  Skin: Skin is warm and dry.  Psychiatric: She has a normal mood and affect. Her behavior is normal. Judgment normal.     UC Treatments / Results  Labs (all labs ordered are listed, but only abnormal results are displayed) Labs Reviewed - No data to display  EKG None  Radiology No results found.  Procedures Procedures (including critical care time)  Medications Ordered in UC Medications  ipratropium-albuterol (DUONEB) 0.5-2.5 (3) MG/3ML nebulizer solution 3 mL (3 mLs Nebulization Given 10/03/18 1233)    Initial Impression / Assessment and Plan / UC Course  I have reviewed the triage vital signs and the nursing notes.  Pertinent labs & imaging results that were available during my care of the patient were reviewed by me and considered in my medical decision making (see chart for details).    Patient with improved symptoms after DuoNeb treatment.  Lungs continue to be clear to auscultation bilaterally, improved air movement.  Prednisone as directed.  Patient to continue albuterol as needed.  Antihistamine as directed.  Return precautions given.  Patient expresses understanding and agrees to plan.  Final Clinical Impressions(s) / UC Diagnoses   Final diagnoses:  Mild intermittent asthma with exacerbation    ED Prescriptions    Medication Sig Dispense Auth. Provider   predniSONE (DELTASONE) 50 MG tablet Take 1 tablet (50 mg total) by mouth daily. 5 tablet Kalix Meinecke V, PA-C   cetirizine (ZYRTEC) 10 MG tablet Take 1 tablet (10 mg total) by mouth daily. 15 tablet Threasa Alpha, [place] 10/03/18 [phone]

## 2018-10-03 NOTE — ED Triage Notes (Signed)
Pt cc asthma flares up and cough with some chest discomfort x 2 days

## 2018-10-03 NOTE — Discharge Instructions (Signed)
Start prednisone as directed. Start zyrtec as directed. Continue albuterol as needed. You can use over the counter nasal saline rinse such as neti pot for nasal congestion. Keep hydrated, your urine should be clear to pale yellow in color. Tylenol/motrin for fever and pain. Monitor for any worsening of symptoms, chest pain, shortness of breath, wheezing, swelling of the throat, follow up for reevaluation.

## 2018-12-06 ENCOUNTER — Encounter (HOSPITAL_COMMUNITY): Payer: Self-pay

## 2018-12-06 ENCOUNTER — Emergency Department (HOSPITAL_COMMUNITY)
Admission: EM | Admit: 2018-12-06 | Discharge: 2018-12-07 | Disposition: A | Discharge status: Home / Self Care | Payer: Self-pay | Attending: Emergency Medicine | Admitting: Emergency Medicine

## 2018-12-06 ENCOUNTER — Other Ambulatory Visit: Payer: Self-pay

## 2018-12-06 DIAGNOSIS — J45909 Unspecified asthma, uncomplicated: Secondary | ICD-10-CM | POA: Insufficient documentation

## 2018-12-06 DIAGNOSIS — R110 Nausea: Secondary | ICD-10-CM

## 2018-12-06 DIAGNOSIS — R112 Nausea with vomiting, unspecified: Secondary | ICD-10-CM | POA: Insufficient documentation

## 2018-12-06 LAB — URINALYSIS, ROUTINE W REFLEX MICROSCOPIC
BILIRUBIN URINE: NEGATIVE
GLUCOSE, UA: NEGATIVE mg/dL
Hgb urine dipstick: NEGATIVE
KETONES UR: NEGATIVE mg/dL
Leukocytes, UA: NEGATIVE
NITRITE: NEGATIVE
PH: 7 (ref 5.0–8.0)
Protein, ur: NEGATIVE mg/dL
Specific Gravity, Urine: 1.018 (ref 1.005–1.030)

## 2018-12-06 LAB — I-STAT BETA HCG BLOOD, ED (MC, WL, AP ONLY): I-stat hCG, quantitative: <5 m[IU]/mL (ref <5)

## 2018-12-06 MED ORDER — SODIUM CHLORIDE 0.9% FLUSH: 3.0000 mL | Freq: Once | INTRAVENOUS | Status: DC

## 2018-12-06 NOTE — ED Triage Notes (Signed)
Pt here with abdominal pain, nausea, and a headache.  All statrted yesterday.  Pt states he started her menstrual period and now having all they symptoms on top of that.  A&ox4 ambulatory to triage.

## 2018-12-07 LAB — CBC
HEMATOCRIT: 41.2 % (ref 36.0–46.0)
Hemoglobin: 13.6 g/dL (ref 12.0–15.0)
MCH: 30.8 pg (ref 26.0–34.0)
MCHC: 33 g/dL (ref 30.0–36.0)
MCV: 93.2 fL (ref 80.0–100.0)
Platelets: 350 10*3/uL (ref 150–400)
RBC: 4.42 MIL/uL (ref 3.87–5.11)
RDW: 12.7 % (ref 11.5–15.5)
WBC: 10.9 10*3/uL — AB (ref 4.0–10.5)
nRBC: 0 % (ref 0.0–0.2)

## 2018-12-07 LAB — COMPREHENSIVE METABOLIC PANEL
ALT: 15 U/L (ref 0–44)
AST: 19 U/L (ref 15–41)
Albumin: 4 g/dL (ref 3.5–5.0)
Alkaline Phosphatase: 67 U/L (ref 38–126)
Anion gap: 11 (ref 5–15)
BILIRUBIN TOTAL: 0.9 mg/dL (ref 0.3–1.2)
BUN: 9 mg/dL (ref 6–20)
CALCIUM: 9.1 mg/dL (ref 8.9–10.3)
CO2: 26 mmol/L (ref 22–32)
Chloride: 103 mmol/L (ref 98–111)
Creatinine, Ser: 0.92 mg/dL (ref 0.44–1.00)
Glucose, Bld: 120 mg/dL — ABNORMAL HIGH (ref 70–99)
Potassium: 3.4 mmol/L — ABNORMAL LOW (ref 3.5–5.1)
Sodium: 140 mmol/L (ref 135–145)
TOTAL PROTEIN: 7.3 g/dL (ref 6.5–8.1)

## 2018-12-07 LAB — LIPASE, BLOOD: LIPASE: 31 U/L (ref 11–51)

## 2018-12-07 MED ORDER — ONDANSETRON 4 MG PO TBDP: ORAL_TABLET | ORAL | 0 refills | Status: DC

## 2018-12-07 NOTE — ED Provider Notes (Signed)
Emergency Department Provider Note   I have reviewed the triage vital signs and the nursing notes.   HISTORY  Chief Complaint Abdominal Pain; Nausea; and Headache   HPI Barbara Cabrera is a 23 y.o. female who originally presented emergency department today for headache, nausea, vomiting, recently started menstrual cycle and having abdominal cramping.  On my evaluation patient is asymptomatic and has been tolerating p.o. long before my evaluation.  She states all of her symptoms are improved.  Her mother is with her which corroborates this.  Her labs are basically unremarkable except for slightly low potassium.  She states she did have some diarrhea during this as well. No other associated or modifying symptoms.    Past Medical History:  Diagnosis Date  . Asthma     There are no active problems to display for this patient.   History reviewed. No pertinent surgical history.  Current Outpatient Rx  . Order #: [id number] Class: Print  . Order #: [id number] Class: Normal  . Order #: [id number] Class: Print  . Order #: [id number] Class: Print  . Order #: [id number] Class: Print  . Order #: [id number] Class: Print  . Order #: [id number] Class: Normal    Allergies Patient has no known allergies.  Family History  Problem Relation Age of Onset  . Diabetes Other   . Hypertension Other     Social History Social History   Tobacco Use  . Smoking status: Never Smoker  . Smokeless tobacco: Never Used  Substance Use Topics  . Alcohol use: No  . Drug use: No    Review of Systems  All other systems negative except as documented in the HPI. All pertinent positives and negatives as reviewed in the HPI. ____________________________________________   PHYSICAL EXAM:  VITAL SIGNS: ED Triage Vitals  Enc Vitals Group     BP 12/06/18 2327 (!) 145/92     Pulse Rate 12/06/18 2327 80     Resp 12/06/18 2327 16     Temp 12/06/18 2327 98.1 F (36.7 C)     Temp Source 12/06/18 2327 Oral    SpO2 12/06/18 2327 98 %     Weight --      Height --      Head Circumference --      Peak Flow --      Pain Score 12/06/18 2318 5     Pain Loc --      Pain Edu? --      Excl. in GC? --     Constitutional: Alert and oriented. Well appearing and in no acute distress. Eyes: Conjunctivae are normal. PERRL. EOMI. Head: Atraumatic. Nose: No congestion/rhinnorhea. Mouth/Throat: Mucous membranes are moist.  Oropharynx non-erythematous. Neck: No stridor.  No meningeal signs.   Cardiovascular: Normal rate, regular rhythm. Good peripheral circulation. Grossly normal heart sounds.   Respiratory: Normal respiratory effort.  No retractions. Lungs CTAB. Gastrointestinal: Soft and nontender. No distention.  Musculoskeletal: No lower extremity tenderness nor edema. No gross deformities of extremities. Neurologic:  Normal speech and language. No gross focal neurologic deficits are appreciated.  Skin:  Skin is warm, dry and intact. No rash noted.   ____________________________________________   LABS (all labs ordered are listed, but only abnormal results are displayed)  Labs Reviewed  COMPREHENSIVE METABOLIC PANEL - Abnormal; Notable for the following components:      Result Value   Potassium 3.4 (*)    Glucose, Bld 120 (*)    All other components within normal limits  CBC - Abnormal; Notable for the  following components:   WBC 10.9 (*)    All other components within normal limits  LIPASE, BLOOD  URINALYSIS, ROUTINE W REFLEX MICROSCOPIC  I-STAT BETA HCG BLOOD, ED ([facility], [facility], [facility] ONLY)   ____________________________________________  EKG   EKG Interpretation  Date/Time:    Ventricular Rate:    PR Interval:    QRS Duration:   QT Interval:    QTC Calculation:   R Axis:     Text Interpretation:         ____________________________________________  RADIOLOGY  No results found.  ____________________________________________   PROCEDURES  Procedure(s) performed:    Procedures   ____________________________________________   INITIAL IMPRESSION / ASSESSMENT AND PLAN / ED COURSE  Labs okay.  Asymptomatic this point.  Abdominal exam is benign.  Possibly just a viral thing versus some that she ate.  They will continue to monitor.  Stable for discharge.     Pertinent labs & imaging results that were available during my care of the patient were reviewed by me and considered in my medical decision making (see chart for details).  ____________________________________________  FINAL CLINICAL IMPRESSION(S) / ED DIAGNOSES  Final diagnoses:  Nausea  Nausea and vomiting, intractability of vomiting not specified, unspecified vomiting type     MEDICATIONS GIVEN DURING THIS VISIT:  Medications - No data to display   NEW OUTPATIENT MEDICATIONS STARTED DURING THIS VISIT:  Discharge Medication List as of 12/07/2018  7:43 AM    START taking these medications   Details  ondansetron (ZOFRAN ODT) 4 MG disintegrating tablet 4mg  ODT q4 hours prn nausea/vomit, Print        Note:  This note was prepared with assistance of Dragon voice recognition software. Occasional wrong-word or sound-a-like substitutions may have occurred due to the inherent limitations of voice recognition software.   Marily Memos, MD 12/07/18 [phone]

## 2019-09-25 ENCOUNTER — Emergency Department (HOSPITAL_COMMUNITY)
Admission: EM | Admit: 2019-09-25 | Discharge: 2019-09-26 | Disposition: A | Discharge status: Home / Self Care | Payer: Self-pay | Attending: Emergency Medicine | Admitting: Emergency Medicine

## 2019-09-25 ENCOUNTER — Other Ambulatory Visit: Payer: Self-pay

## 2019-09-25 ENCOUNTER — Encounter (HOSPITAL_COMMUNITY): Payer: Self-pay | Admitting: Emergency Medicine

## 2019-09-25 DIAGNOSIS — Z79899 Other long term (current) drug therapy: Secondary | ICD-10-CM | POA: Insufficient documentation

## 2019-09-25 DIAGNOSIS — R42 Dizziness and giddiness: Secondary | ICD-10-CM | POA: Insufficient documentation

## 2019-09-25 LAB — CBC WITH DIFFERENTIAL/PLATELET
Abs Immature Granulocytes: 0.02 10*3/uL (ref 0.00–0.07)
Basophils Absolute: 0 10*3/uL (ref 0.0–0.1)
Basophils Relative: 0 %
Eosinophils Absolute: 0.1 10*3/uL (ref 0.0–0.5)
Eosinophils Relative: 1 %
HCT: 43 % (ref 36.0–46.0)
Hemoglobin: 14 g/dL (ref 12.0–15.0)
Immature Granulocytes: 0 %
Lymphocytes Relative: 24 %
Lymphs Abs: 2.4 10*3/uL (ref 0.7–4.0)
MCH: 29.6 pg (ref 26.0–34.0)
MCHC: 32.6 g/dL (ref 30.0–36.0)
MCV: 90.9 fL (ref 80.0–100.0)
Monocytes Absolute: 0.6 10*3/uL (ref 0.1–1.0)
Monocytes Relative: 6 %
Neutro Abs: 7.1 10*3/uL (ref 1.7–7.7)
Neutrophils Relative %: 69 %
Platelets: 328 10*3/uL (ref 150–400)
RBC: 4.73 MIL/uL (ref 3.87–5.11)
RDW: 12.5 % (ref 11.5–15.5)
WBC: 10.3 10*3/uL (ref 4.0–10.5)
nRBC: 0 % (ref 0.0–0.2)

## 2019-09-25 LAB — I-STAT BETA HCG BLOOD, ED (MC, WL, AP ONLY): I-stat hCG, quantitative: <5 m[IU]/mL (ref <5)

## 2019-09-25 LAB — COMPREHENSIVE METABOLIC PANEL
ALT: 24 U/L (ref 0–44)
AST: 19 U/L (ref 15–41)
Albumin: 4 g/dL (ref 3.5–5.0)
Alkaline Phosphatase: 84 U/L (ref 38–126)
Anion gap: 10 (ref 5–15)
BUN: 11 mg/dL (ref 6–20)
CO2: 25 mmol/L (ref 22–32)
Calcium: 9.6 mg/dL (ref 8.9–10.3)
Chloride: 102 mmol/L (ref 98–111)
Creatinine, Ser: 0.86 mg/dL (ref 0.44–1.00)
GFR calc Af Amer: >60 mL/min (ref >60)
GFR calc non Af Amer: >60 mL/min (ref >60)
Glucose, Bld: 215 mg/dL — ABNORMAL HIGH (ref 70–99)
Potassium: 4 mmol/L (ref 3.5–5.1)
Sodium: 137 mmol/L (ref 135–145)
Total Bilirubin: 0.9 mg/dL (ref 0.3–1.2)
Total Protein: 7.4 g/dL (ref 6.5–8.1)

## 2019-09-25 LAB — URINALYSIS, ROUTINE W REFLEX MICROSCOPIC
Bacteria, UA: NONE SEEN
Bilirubin Urine: NEGATIVE
Glucose, UA: NEGATIVE mg/dL
Hgb urine dipstick: NEGATIVE
Ketones, ur: NEGATIVE mg/dL
Nitrite: NEGATIVE
Protein, ur: NEGATIVE mg/dL
Specific Gravity, Urine: 1.005 (ref 1.005–1.030)
pH: 6 (ref 5.0–8.0)

## 2019-09-25 LAB — CBG MONITORING, ED: Glucose-Capillary: 188 mg/dL — ABNORMAL HIGH (ref 70–99)

## 2019-09-25 NOTE — ED Triage Notes (Signed)
Patient reports feeling lightheaded /mild dizziness this evening , alert and oriented , no neuro deficits, respirations unlabored/ambulatory .

## 2019-09-26 NOTE — Discharge Instructions (Signed)
Work-up overall reassuring today, Try to make sure to rest, stay hydrated, eat regular meals. Follow-up with your primary care doctor. Return here for any new/acute changes.

## 2019-09-26 NOTE — ED Provider Notes (Signed)
[facility] EMERGENCY DEPARTMENT Provider Note   CSN: [id number] Arrival date & time: 09/25/19  1825     History   Chief Complaint Chief Complaint  Patient presents with  . Lightheaded    HPI Barbara Cabrera is a 23 y.o. female.     The history is provided by the patient and medical records.     23 y.o. F with hx of anxiety and asthma, presenting to the ED after near syncopal event.  Patient reports he was driving in the car today with her godmother when she all of a sudden felt very dizzy and like she might pass out, little chest tightness but no SOB.  States initially she thought it was an anxiety attack.  States she was able to pull the car over and never fully lost consciousness.  States she drank some water, ate a snack and started feeling somewhat better.  States godmother just wanted her to get checked out.  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  She reports she has not been eating/drinking like she should and has not had much sleep as she is still working as well.  She denies chest pain or SOB currently.  Has been drinking water while waiting which seems to help.  Past Medical History:  Diagnosis Date  . Asthma     There are no active problems to display for this patient.   History reviewed. No pertinent surgical history.   OB History   No obstetric history on file.      Home Medications    Prior to Admission medications   Medication Sig Start Date End Date Taking? Authorizing Provider  albuterol (PROVENTIL HFA;VENTOLIN HFA) 108 (90 Base) MCG/ACT inhaler Inhale 1-2 puffs into the lungs every 6 (six) hours as needed for wheezing or shortness of breath. 05/11/18   Langston Masker B, PA-C  cetirizine (ZYRTEC) 10 MG tablet Take 1 tablet (10 mg total) by mouth daily. 10/03/18   Tasia Catchings, Amy V, PA-C  methocarbamol (ROBAXIN) 500 MG tablet Take 1 tablet (500 mg total) by mouth 2 (two)  times daily. Patient not taking: Reported on 05/11/2018 02/23/18   Ashley Murrain, NP  montelukast (SINGULAIR) 10 MG tablet Take 1 tablet (10 mg total) by mouth at bedtime. 05/11/18   Langston Masker B, PA-C  naproxen (NAPROSYN) 500 MG tablet Take 1 tablet (500 mg total) by mouth 2 (two) times daily. 02/23/18   Ashley Murrain, NP  ondansetron (ZOFRAN ODT) 4 MG disintegrating tablet 4mg  ODT q4 hours prn nausea/vomit 12/07/18   Mesner, Corene Cornea, MD  predniSONE (DELTASONE) 50 MG tablet Take 1 tablet (50 mg total) by mouth daily. 10/03/18   Ok Edwards, PA-C    Family History Family History  Problem Relation Age of Onset  . Diabetes Other   . Hypertension Other     Social History Social History   Tobacco Use  . Smoking status: Never Smoker  . Smokeless tobacco: Never Used  Substance Use Topics  . Alcohol use: No  . Drug use: No     Allergies   Patient has no known allergies.   Review of Systems Review of Systems  Neurological: Positive for light-headedness.  All other systems reviewed and are negative.    Physical Exam Updated Vital Signs BP 119/75 (BP Location: Left Arm)   Pulse 78   Temp (!) 97.4 F (36.3 C) (Oral)  Resp 14   SpO2 100%   Physical Exam Vitals signs and nursing note reviewed.  Constitutional:      Appearance: She is well-developed.  HENT:     Head: Normocephalic and atraumatic.  Eyes:     Conjunctiva/sclera: Conjunctivae normal.     Pupils: Pupils are equal, round, and reactive to light.  Neck:     Musculoskeletal: Normal range of motion.  Cardiovascular:     Rate and Rhythm: Normal rate and regular rhythm.     Heart sounds: Normal heart sounds.  Pulmonary:     Effort: Pulmonary effort is normal.     Breath sounds: Normal breath sounds.  Abdominal:     General: Bowel sounds are normal.     Palpations: Abdomen is soft.  Musculoskeletal: Normal range of motion.  Skin:    General: Skin is warm and dry.  Neurological:     Mental Status: She is alert  and oriented to person, place, and time.     Comments: AAOx3, answering questions and following commands appropriately; equal strength UE and LE bilaterally; CN grossly intact; moves all extremities appropriately without ataxia; no focal neuro deficits or facial asymmetry appreciated      ED Treatments / Results  Labs (all labs ordered are listed, but only abnormal results are displayed) Labs Reviewed  COMPREHENSIVE METABOLIC PANEL - Abnormal; Notable for the following components:      Result Value   Glucose, Bld 215 (*)    All other components within normal limits  URINALYSIS, ROUTINE W REFLEX MICROSCOPIC - Abnormal; Notable for the following components:   Color, Urine STRAW (*)    Leukocytes,Ua TRACE (*)    All other components within normal limits  CBG MONITORING, ED - Abnormal; Notable for the following components:   Glucose-Capillary 188 (*)    All other components within normal limits  CBC WITH DIFFERENTIAL/PLATELET  I-STAT BETA HCG BLOOD, ED ([facility], [facility], [facility] ONLY)    EKG None   ED ECG REPORT   Date: 09/26/2019  Rate: 75  Rhythm: normal sinus rhythm  QRS Axis: normal  Intervals: normal  ST/T Wave abnormalities: normal  Conduction Disutrbances:none  Narrative Interpretation:   Old EKG Reviewed: unchanged  I have personally reviewed the EKG tracing and agree with the computerized printout as noted.   Radiology No results found.  Procedures Procedures (including critical care time)  Medications Ordered in ED Medications - No data to display   Initial Impression / Assessment and Plan / ED Course  I have reviewed the triage vital signs and the nursing notes.  Pertinent labs & imaging results that were available during my care of the patient were reviewed by me and considered in my medical decision making (see chart for details).  23 year old female here after episode of lightheadedness while driving car, she is unsure if this was panic attack as she does have  history of anxiety.  She did not lose consciousness, was able to pull the car over.  She has been drinking water since then reports feeling better.  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  Here she is awake, alert, appropriately oriented.  She is drinking water without issue.  She does not have any focal neurologic deficits.  Her labs are overall reassuring.  EKG is nonischemic.  Suspect this may be stress/anxiety/fatigue related.  Recommended to rest, good oral hydration.  Close follow-up with PCP.  Return here for any new/acute changes.  Final Clinical Impressions(s) / ED Diagnoses   Final diagnoses:  Lightheadedness    ED Discharge Orders    None       Garlon Hatchet, PA-C 09/26/19 0144    Nira Conn, MD 09/26/19 [phone]

## 2020-01-01 ENCOUNTER — Encounter (HOSPITAL_COMMUNITY): Payer: Self-pay | Admitting: Emergency Medicine

## 2020-01-01 ENCOUNTER — Emergency Department (HOSPITAL_COMMUNITY): Payer: Self-pay

## 2020-01-01 ENCOUNTER — Other Ambulatory Visit: Payer: Self-pay

## 2020-01-01 ENCOUNTER — Emergency Department (HOSPITAL_COMMUNITY)
Admission: EM | Admit: 2020-01-01 | Discharge: 2020-01-01 | Disposition: A | Discharge status: Home / Self Care | Payer: Self-pay | Attending: Emergency Medicine | Admitting: Emergency Medicine

## 2020-01-01 DIAGNOSIS — R0789 Other chest pain: Secondary | ICD-10-CM

## 2020-01-01 DIAGNOSIS — E119 Type 2 diabetes mellitus without complications: Secondary | ICD-10-CM | POA: Insufficient documentation

## 2020-01-01 DIAGNOSIS — J45909 Unspecified asthma, uncomplicated: Secondary | ICD-10-CM | POA: Insufficient documentation

## 2020-01-01 DIAGNOSIS — Z79899 Other long term (current) drug therapy: Secondary | ICD-10-CM | POA: Insufficient documentation

## 2020-01-01 DIAGNOSIS — Z20822 Contact with and (suspected) exposure to covid-19: Secondary | ICD-10-CM | POA: Insufficient documentation

## 2020-01-01 LAB — URINALYSIS, ROUTINE W REFLEX MICROSCOPIC
Bilirubin Urine: NEGATIVE
Glucose, UA: >=500 mg/dL — AB
Ketones, ur: NEGATIVE mg/dL
Leukocytes,Ua: NEGATIVE
Nitrite: NEGATIVE
Protein, ur: NEGATIVE mg/dL
Specific Gravity, Urine: 1.01 (ref 1.005–1.030)
pH: 9 — ABNORMAL HIGH (ref 5.0–8.0)

## 2020-01-01 LAB — COMPREHENSIVE METABOLIC PANEL
ALT: 25 U/L (ref 0–44)
AST: 18 U/L (ref 15–41)
Albumin: 3.8 g/dL (ref 3.5–5.0)
Alkaline Phosphatase: 107 U/L (ref 38–126)
Anion gap: 11 (ref 5–15)
BUN: 10 mg/dL (ref 6–20)
CO2: 23 mmol/L (ref 22–32)
Calcium: 9.9 mg/dL (ref 8.9–10.3)
Chloride: 104 mmol/L (ref 98–111)
Creatinine, Ser: 0.87 mg/dL (ref 0.44–1.00)
GFR calc Af Amer: >60 mL/min (ref >60)
GFR calc non Af Amer: >60 mL/min (ref >60)
Glucose, Bld: 276 mg/dL — ABNORMAL HIGH (ref 70–99)
Potassium: 3.7 mmol/L (ref 3.5–5.1)
Sodium: 138 mmol/L (ref 135–145)
Total Bilirubin: 0.6 mg/dL (ref 0.3–1.2)
Total Protein: 7.4 g/dL (ref 6.5–8.1)

## 2020-01-01 LAB — HEMOGLOBIN A1C
Hgb A1c MFr Bld: 9.3 % — ABNORMAL HIGH (ref 4.8–5.6)
Mean Plasma Glucose: 220.21 mg/dL

## 2020-01-01 LAB — CBC
HCT: 41 % (ref 36.0–46.0)
Hemoglobin: 13.5 g/dL (ref 12.0–15.0)
MCH: 29.7 pg (ref 26.0–34.0)
MCHC: 32.9 g/dL (ref 30.0–36.0)
MCV: 90.3 fL (ref 80.0–100.0)
Platelets: 367 10*3/uL (ref 150–400)
RBC: 4.54 MIL/uL (ref 3.87–5.11)
RDW: 12.8 % (ref 11.5–15.5)
WBC: 12.4 10*3/uL — ABNORMAL HIGH (ref 4.0–10.5)
nRBC: 0 % (ref 0.0–0.2)

## 2020-01-01 LAB — I-STAT BETA HCG BLOOD, ED (MC, WL, AP ONLY): I-stat hCG, quantitative: <5 m[IU]/mL (ref <5)

## 2020-01-01 LAB — LIPASE, BLOOD: Lipase: 30 U/L (ref 11–51)

## 2020-01-01 LAB — POC SARS CORONAVIRUS 2 AG -  ED: SARS Coronavirus 2 Ag: NEGATIVE

## 2020-01-01 MED ORDER — SODIUM CHLORIDE 0.9% FLUSH: 3.0000 mL | Freq: Once | INTRAVENOUS | Status: DC

## 2020-01-01 MED ORDER — METFORMIN HCL 1000 MG PO TABS: 500.0000 mg | ORAL_TABLET | Freq: Two times a day (BID) | ORAL | 0 refills | Status: DC

## 2020-01-01 NOTE — ED Triage Notes (Signed)
Pt c/o shortness of breath, nasal congestion and sneezing. Denies fever/cough, no known covid+ contacts.

## 2020-01-01 NOTE — ED Notes (Signed)
Pt ambulated well to the BR with no gait/mobility issues.  

## 2020-01-01 NOTE — ED Notes (Signed)
Sent a urine culture with the urine specimen 

## 2020-01-01 NOTE — ED Notes (Signed)
Pt HR rose to 120s; Pt instructed to breathe properly and deeply, HR decreased to 80s and pt feels better. EDP made aware.

## 2020-01-01 NOTE — Discharge Instructions (Signed)
Please read and follow all provided instructions.  Your diagnoses today include:  1. Type 2 diabetes mellitus without complication, without long-term current use of insulin (HCC)   2. Chest tightness     Tests performed today include:  Chest x-ray - no pneumonia  Blood test - elevated blood sugar  Hemoglobin A1c - 9.3%, please work your doctor to get this closer to 7%  Rapid COVID test - negative, sendout test pending  Vital signs. See below for your results today.   Medications prescribed:   Metformin - medication for diabetes and elevated blood sugars  If you develop diarrhea or GI upset will take this medication, reduce dose to 1/day and then resume 2/day after a week.  These symptoms will improve with time.  It is important that you continue taking the medication do not stop completely.  Take any prescribed medications only as directed.  Home care instructions:  Follow any educational materials contained in this packet.  BE VERY CAREFUL not to take multiple medicines containing Tylenol (also called acetaminophen). Doing so can lead to an overdose which can damage your liver and cause liver failure and possibly death.   Follow-up instructions: Please follow-up with your primary care provider in the next 3 days for further evaluation of your symptoms.   Return instructions:   Please return to the Emergency Department if you experience worsening symptoms.   Please return if you have any other emergent concerns.  Additional Information:  Your vital signs today were: BP 130/87   Pulse 95   Temp 98.1 F (36.7 C) (Oral)   Resp 14   SpO2 98%  If your blood pressure (BP) was elevated above 135/85 this visit, please have this repeated by your doctor within one month. --------------

## 2020-01-01 NOTE — ED Notes (Signed)
Patient verbalizes understanding of discharge instructions. Opportunity for questioning and answers were provided. Armband removed by staff, pt discharged from ED ambulatory to home.  

## 2020-01-01 NOTE — ED Provider Notes (Signed)
[facility] EMERGENCY DEPARTMENT Provider Note   CSN: [id number] Arrival date & time: 01/01/20  1719     History Chief Complaint  Patient presents with  . Shortness of Breath  . Diarrhea    Barbara Cabrera is a 24 y.o. female.  Patient with history of asthma presents to the emergency department today with complaint of chest tightness, shortness of breath, and diarrhea ongoing over the past 3 days.  Patient denies any fevers.  She has had a stuffy nose but no sore throat.  No sharp pains in the chest or chest pain.  No nausea, vomiting.  Diarrhea is a couple of times a day, watery, nonbloody.  No urinary symptoms.  No lower extremity swelling.  No known sick contacts.  She does not have an inhaler to use.  She thinks that she is wheezing at times. Patient denies risk factors for pulmonary embolism including: unilateral leg swelling, history of DVT/PE/other blood clots, use of exogenous hormones, recent immobilizations, recent surgery, recent travel (>4hr segment), malignancy, hemoptysis.          Past Medical History:  Diagnosis Date  . Asthma     There are no problems to display for this patient.   History reviewed. No pertinent surgical history.   OB History   No obstetric history on file.     Family History  Problem Relation Age of Onset  . Diabetes Other   . Hypertension Other     Social History   Tobacco Use  . Smoking status: Never Smoker  . Smokeless tobacco: Never Used  Substance Use Topics  . Alcohol use: No  . Drug use: No    Home Medications Prior to Admission medications   Medication Sig Start Date End Date Taking? Authorizing Provider  albuterol (PROVENTIL HFA;VENTOLIN HFA) 108 (90 Base) MCG/ACT inhaler Inhale 1-2 puffs into the lungs every 6 (six) hours as needed for wheezing or shortness of breath. 05/11/18  Yes Valere Dross, Alyssa B, PA-C  cetirizine (ZYRTEC) 10 MG tablet Take 1 tablet (10 mg total) by mouth daily. Patient not  taking: Reported on 01/01/2020 10/03/18   Ok Edwards, PA-C  methocarbamol (ROBAXIN) 500 MG tablet Take 1 tablet (500 mg total) by mouth 2 (two) times daily. Patient not taking: Reported on 05/11/2018 02/23/18   Ashley Murrain, NP  montelukast (SINGULAIR) 10 MG tablet Take 1 tablet (10 mg total) by mouth at bedtime. Patient not taking: Reported on 01/01/2020 05/11/18   Langston Masker B, PA-C  naproxen (NAPROSYN) 500 MG tablet Take 1 tablet (500 mg total) by mouth 2 (two) times daily. Patient not taking: Reported on 01/01/2020 02/23/18   Ashley Murrain, NP  ondansetron (ZOFRAN ODT) 4 MG disintegrating tablet 4mg  ODT q4 hours prn nausea/vomit Patient not taking: Reported on 01/01/2020 12/07/18   Mesner, Corene Cornea, MD    Allergies    Patient has no known allergies.  Review of Systems   Review of Systems  Constitutional: Negative for fever.  HENT: Positive for congestion and sneezing. Negative for rhinorrhea and sore throat.   Eyes: Negative for redness.  Respiratory: Positive for cough and shortness of breath.   Cardiovascular: Negative for chest pain and palpitations.  Gastrointestinal: Positive for diarrhea. Negative for abdominal pain, nausea and vomiting.  Genitourinary: Negative for dysuria.  Musculoskeletal: Negative for myalgias.  Skin: Negative for rash.  Neurological: Negative for headaches.    Physical Exam Updated Vital Signs BP (!) 172/96 (BP Location: Right Arm)   Pulse  100   Temp 98.1 F (36.7 C) (Oral)   Resp 20   SpO2 100%   Physical Exam Vitals and nursing note reviewed.  Constitutional:      Appearance: She is well-developed.  HENT:     Head: Normocephalic and atraumatic.     Jaw: No trismus.     Right Ear: Tympanic membrane, ear canal and external ear normal.     Left Ear: Tympanic membrane, ear canal and external ear normal.     Nose: Nose normal. No mucosal edema or rhinorrhea.     Mouth/Throat:     Mouth: Mucous membranes are not dry. No oral lesions.     Pharynx:  Uvula midline. No oropharyngeal exudate, posterior oropharyngeal erythema or uvula swelling.     Tonsils: No tonsillar abscesses.  Eyes:     General:        Right eye: No discharge.        Left eye: No discharge.     Conjunctiva/sclera: Conjunctivae normal.  Cardiovascular:     Rate and Rhythm: Regular rhythm. Tachycardia present.     Heart sounds: Normal heart sounds.     Comments: Mild tachycardia. Pulmonary:     Effort: Pulmonary effort is normal. No respiratory distress.     Breath sounds: Normal breath sounds. No wheezing, rhonchi or rales.     Comments: Lungs are clear without wheezing. Abdominal:     Palpations: Abdomen is soft.     Tenderness: There is no abdominal tenderness.     Comments: No abdominal pain or tenderness to palpation.  Musculoskeletal:     Cervical back: Normal range of motion and neck supple.  Lymphadenopathy:     Cervical: No cervical adenopathy.  Skin:    General: Skin is warm and dry.  Neurological:     Mental Status: She is alert.  Psychiatric:        Mood and Affect: Mood is anxious.     ED Results / Procedures / Treatments   Labs (all labs ordered are listed, but only abnormal results are displayed) Labs Reviewed  COMPREHENSIVE METABOLIC PANEL - Abnormal; Notable for the following components:      Result Value   Glucose, Bld 276 (*)    All other components within normal limits  CBC - Abnormal; Notable for the following components:   WBC 12.4 (*)    All other components within normal limits  URINALYSIS, ROUTINE W REFLEX MICROSCOPIC - Abnormal; Notable for the following components:   pH 9.0 (*)    Glucose, UA >=500 (*)    Hgb urine dipstick LARGE (*)    Bacteria, UA RARE (*)    All other components within normal limits  HEMOGLOBIN A1C - Abnormal; Notable for the following components:   Hgb A1c MFr Bld 9.3 (*)    All other components within normal limits  SARS CORONAVIRUS 2 (TAT 6-24 HRS)  LIPASE, BLOOD  I-STAT BETA HCG BLOOD, ED  ([facility], [facility], [facility] ONLY)  POC SARS CORONAVIRUS 2 AG -  ED    EKG None  Radiology DG Chest Port 1 View  Result Date: 01/01/2020 CLINICAL DATA:  ED PORT Patient reports feeling lightheaded /mild dizziness this evening , alert and oriented , no neuro deficits, respirations unlabored/ambulatory . EXAM: PORTABLE CHEST 1 VIEW COMPARISON:  Chest radiograph 05/11/2018 FINDINGS: The heart size and mediastinal contours are within normal limits given AP technique. The lungs are clear. No pneumothorax or large pleural effusion. The visualized skeletal structures are unremarkable.  IMPRESSION: No acute cardiopulmonary process. Electronically Signed   By: Emmaline Kluver M.D.   On: 01/01/2020 19:04    Procedures Procedures (including critical care time)  Medications Ordered in ED Medications  sodium chloride flush (NS) 0.9 % injection 3 mL (0 mLs Intravenous Hold 01/01/20 1809)    ED Course  I have reviewed the triage vital signs and the nursing notes.  Pertinent labs & imaging results that were available during my care of the patient were reviewed by me and considered in my medical decision making (see chart for details).  Patient seen and examined. Work-up initiated. Elevated CBG noted.  Vital signs reviewed and are as follows: BP (!) 172/96 (BP Location: Right Arm)   Pulse 100   Temp 98.1 F (36.7 C) (Oral)   Resp 20   SpO2 100%   7:16 PM A1c is 9.3%.  This is a new diagnosis of likely type 2 diabetes.  Patient was started on Metformin.  Patient states that her sister was just diagnosed with diabetes as well.  Awaiting rapid Covid, UA.  Chest x-ray is normal.  10:14 PM remainder of work-up is reassuring.  PCR Covid test will be sent.  Patient given rx for Metformin and counseled.  Discussed need to isolate if PCR Covid test comes back as positive.  Encouraged PCP follow-up in the next week for diabetes care.  Encouraged return with any worsening symptoms or other concerns.    MDM  Rules/Calculators/A&P                      Patient with constellation of chest tightness, diarrhea, URI symptoms.  No fevers, vomiting, cough.  Chest x-ray is clear.  Lab work showed elevated blood sugar.  A1c was elevated 9.3%.  Patient has new onset type II diabetic.  She was started on Metformin for this.  She does have primary care to follow-up with.  Rapid Covid test negative, send out pending.  Patient looks well and can be discharged home with symptomatic treatment as well as initiation of diabetic treatment.   Final Clinical Impression(s) / ED Diagnoses Final diagnoses:  Type 2 diabetes mellitus without complication, without long-term current use of insulin (HCC)  Chest tightness    Rx / DC Orders ED Discharge Orders    None       Renne Crigler, Cordelia Poche 01/01/20 2218    Charlynne Pander, MD 01/01/20 [phone]

## 2020-01-02 LAB — SARS CORONAVIRUS 2 (TAT 6-24 HRS): SARS Coronavirus 2: NEGATIVE

## 2020-02-20 ENCOUNTER — Encounter (HOSPITAL_COMMUNITY): Payer: Self-pay | Admitting: Emergency Medicine

## 2020-02-20 ENCOUNTER — Emergency Department (HOSPITAL_COMMUNITY)
Admission: EM | Admit: 2020-02-20 | Discharge: 2020-02-20 | Disposition: A | Discharge status: Home / Self Care | Payer: Self-pay | Attending: Emergency Medicine | Admitting: Emergency Medicine

## 2020-02-20 DIAGNOSIS — R202 Paresthesia of skin: Secondary | ICD-10-CM | POA: Insufficient documentation

## 2020-02-20 DIAGNOSIS — R0789 Other chest pain: Secondary | ICD-10-CM

## 2020-02-20 DIAGNOSIS — F419 Anxiety disorder, unspecified: Secondary | ICD-10-CM

## 2020-02-20 DIAGNOSIS — Z8616 Personal history of COVID-19: Secondary | ICD-10-CM | POA: Insufficient documentation

## 2020-02-20 DIAGNOSIS — E119 Type 2 diabetes mellitus without complications: Secondary | ICD-10-CM | POA: Insufficient documentation

## 2020-02-20 LAB — CBG MONITORING, ED: Glucose-Capillary: 183 mg/dL — ABNORMAL HIGH (ref 70–99)

## 2020-02-20 NOTE — ED Provider Notes (Signed)
[facility] EMERGENCY DEPARTMENT Provider Note   CSN: [id number] Arrival date & time: 02/20/20  1315     History Chief Complaint  Patient presents with  . Chest Pain    Britta Louth is a 24 y.o. female.  HPI HPI Comments: 24 y.o. female with complaints/comments per nursing/medical assistant note, with all such history reviewed for accuracy and confirmed by myself.  The patient's relevant past medical, surgical and social history was reviewed in [facility].  HPI Comments: Meilin Brosh is a 24 y.o. female with a history of asthma, type 2 diabetes mellitus, anxiety who presents to the Emergency Department complaining of intermittent tingling and chest tightness x 3 days.  Patient states that she was diagnosed with COVID-19 on March 15 of last month and has been Covid clear for 1 week.  She has a history of asthma and states that she was experiencing intermittent episodes of shortness of breath and wheezing during her bout with COVID-19 but since denies any acute episodes for the past 2 weeks.  She has not used her albuterol inhaler the past 2 weeks.  She was recently diagnosed with diabetes mellitus type 2 with a hemoglobin A1c of 9.3 on February 10.  She was taking Metformin but states that she stopped because "I do not like taking pills".  Patient notes that she has a history of anxiety and felt that has been worse recently.  She states "I have lost loved ones to COVID-19" and feels increasingly anxious recently due to her COVID-19 diagnosis.  She most recently met with her primary care provider last week but states that she has never discussed her anxiety with a medical professional.  She denies fevers, chills, URI symptoms, chest pain, shortness of breath, leg swelling, abdominal pain, nausea, vomiting, diarrhea, urinary changes, syncope.     Past Medical History:  Diagnosis Date  . Asthma     There are no problems to display for this patient.   No past surgical  history on file.   OB History   No obstetric history on file.     Family History  Problem Relation Age of Onset  . Diabetes Other   . Hypertension Other     Social History   Tobacco Use  . Smoking status: Never Smoker  . Smokeless tobacco: Never Used  Substance Use Topics  . Alcohol use: No  . Drug use: No    Home Medications Prior to Admission medications   Medication Sig Start Date End Date Taking? Authorizing Provider  albuterol (PROVENTIL HFA;VENTOLIN HFA) 108 (90 Base) MCG/ACT inhaler Inhale 1-2 puffs into the lungs every 6 (six) hours as needed for wheezing or shortness of breath. 05/11/18  Yes Valere Dross, Alyssa B, PA-C  metFORMIN (GLUCOPHAGE) 1000 MG tablet Take 0.5 tablets (500 mg total) by mouth 2 (two) times daily. Patient not taking: Reported on 02/20/2020 01/01/20   Carlisle Cater, PA-C  cetirizine (ZYRTEC) 10 MG tablet Take 1 tablet (10 mg total) by mouth daily. Patient not taking: Reported on 01/01/2020 10/03/18 01/01/20  Ok Edwards, PA-C  montelukast (SINGULAIR) 10 MG tablet Take 1 tablet (10 mg total) by mouth at bedtime. Patient not taking: Reported on 01/01/2020 05/11/18 01/01/20  Albesa Seen, PA-C    Allergies    Patient has no known allergies.  Review of Systems   Review of Systems  Constitutional: Negative for chills and fever.  HENT: Negative for congestion and rhinorrhea.   Respiratory: Positive for chest tightness. Negative for shortness  of breath.   Cardiovascular: Negative for chest pain.  Gastrointestinal: Negative for abdominal pain, diarrhea, nausea and vomiting.  Genitourinary: Negative for dysuria and hematuria.  Neurological: Positive for numbness (Paresthesias). Negative for syncope.  Psychiatric/Behavioral: The patient is nervous/anxious.   All other systems reviewed and are negative.  Physical Exam Updated Vital Signs BP 125/86 (BP Location: Left Arm)   Pulse 72   Resp 20   SpO2 99%   Physical Exam Vitals and nursing note reviewed.    Constitutional:      General: She is not in acute distress.    Appearance: She is well-developed and normal weight. She is not ill-appearing, toxic-appearing or diaphoretic.  HENT:     Head: Normocephalic and atraumatic.  Eyes:     Extraocular Movements: Extraocular movements intact.     Pupils: Pupils are equal, round, and reactive to light.  Cardiovascular:     Rate and Rhythm: Normal rate and regular rhythm.  No extrasystoles are present.    Pulses:          Radial pulses are 2+ on the right side and 2+ on the left side.       Dorsalis pedis pulses are 2+ on the right side and 2+ on the left side.     Heart sounds: Normal heart sounds. No systolic murmur. No diastolic murmur. No friction rub. No gallop. No S3 or S4 sounds.   Pulmonary:     Effort: Pulmonary effort is normal. No tachypnea, accessory muscle usage or respiratory distress.     Breath sounds: Normal breath sounds. No stridor. No decreased breath sounds, wheezing, rhonchi or rales.  Chest:     Chest wall: No deformity, tenderness or crepitus.  Abdominal:     General: There is no abdominal bruit.     Palpations: Abdomen is soft.  Musculoskeletal:        General: Normal range of motion.     Cervical back: Normal range of motion and neck supple.     Right lower leg: No edema.     Left lower leg: No edema.  Skin:    General: Skin is warm and dry.     Capillary Refill: Capillary refill takes less than 2 seconds.  Neurological:     General: No focal deficit present.     Mental Status: She is alert and oriented to person, place, and time.  Psychiatric:        Mood and Affect: Mood normal.        Behavior: Behavior normal.    ED Results / Procedures / Treatments   Labs (all labs ordered are listed, but only abnormal results are displayed) Labs Reviewed  CBG MONITORING, ED - Abnormal; Notable for the following components:      Result Value   Glucose-Capillary 183 (*)    All other components within normal limits    EKG EKG Interpretation  Date/Time:  Thursday February 20 2020 13:15:57 EDT Ventricular Rate:  98 PR Interval:  142 QRS Duration: 86 QT Interval:  340 QTC Calculation: 434 R Axis:   62 Text Interpretation: Normal sinus rhythm Nonspecific T wave abnormality Abnormal ECG no significant change since Feb 2021 Confirmed by Sherwood Gambler [phone]) on 02/20/2020 1:23:38 PM  Radiology No results found.  Procedures Procedures  Medications Ordered in ED Medications - No data to display  ED Course  I have reviewed the triage vital signs and the nursing notes.  Pertinent labs & imaging results that were available during my  care of the patient were reviewed by me and considered in my medical decision making (see chart for details).    MDM Rules/Calculators/A&P                      Patient is a 24 year old African-American female that presents today with worsening anxiety and intermittent sensations of chest tightness and paresthesias in the chest and arms.  Her ECG was reassuring.  She was diagnosed with COVID-19 last month and has been Covid negative for the past week.  She does have a history of seasonal allergies and asthma but states she has not been short of breath or felt the need to use her albuterol inhaler for the last 2 weeks.  She was also recently diagnosed with diabetes mellitus type 2 and was placed on Metformin but states that she has not been taking Metformin recently.  I rechecked her CBG which was 183.  I discussed this with the patient and also discussed her need to follow-up with her primary care provider regarding both her diabetes as well as her anxiety.  We also discussed restarting her Metformin and she states she will do so.  Strict return precautions given and she understands she can return to the emergency department at anytime with any new or worsening symptoms.  She verbalized understanding of the above and her questions were answered.  She was amicable at the time of  discharge.  Vital signs stable.  Final Clinical Impression(s) / ED Diagnoses Final diagnoses:  Anxiety  Feeling of chest tightness    Rx / DC Orders ED Discharge Orders    None       Rayna Sexton, PA-C 02/20/20 1511    Sherwood Gambler, MD 02/20/20 [phone]

## 2020-02-20 NOTE — Discharge Instructions (Addendum)
Per our discussion, please remember to follow-up with her primary care provider regarding both your anxiety as well as your diabetes management.  Please start taking her Metformin again.  Your blood glucose was 183 at your visit today which is high.  Please do not hesitate to return to the emergency department with any new or worsening symptoms.

## 2020-02-20 NOTE — ED Triage Notes (Signed)
Pt arrives to ED with c/o of cp over the last month, pt reports being diagnosed with covid on march 15th and also has asthma. Pt reports a dull ache across her chest with a dry cough.

## 2020-02-20 NOTE — ED Notes (Signed)
Pt given dc instructions pt verbalizes understanding.  

## 2020-02-20 NOTE — ED Notes (Signed)
Checked patient cbg it was 77 notified RN of blood sugar

## 2020-02-24 ENCOUNTER — Emergency Department (HOSPITAL_COMMUNITY): Payer: Self-pay

## 2020-02-24 ENCOUNTER — Other Ambulatory Visit: Payer: Self-pay

## 2020-02-24 ENCOUNTER — Encounter (HOSPITAL_COMMUNITY): Payer: Self-pay

## 2020-02-24 ENCOUNTER — Emergency Department (HOSPITAL_COMMUNITY)
Admission: EM | Admit: 2020-02-24 | Discharge: 2020-02-25 | Disposition: A | Discharge status: Home / Self Care | Payer: Self-pay | Attending: Emergency Medicine | Admitting: Emergency Medicine

## 2020-02-24 DIAGNOSIS — J45901 Unspecified asthma with (acute) exacerbation: Secondary | ICD-10-CM | POA: Insufficient documentation

## 2020-02-24 DIAGNOSIS — R0789 Other chest pain: Secondary | ICD-10-CM

## 2020-02-24 LAB — BASIC METABOLIC PANEL
Anion gap: 10 (ref 5–15)
BUN: 25 mg/dL — ABNORMAL HIGH (ref 6–20)
CO2: 22 mmol/L (ref 22–32)
Calcium: 9.7 mg/dL (ref 8.9–10.3)
Chloride: 106 mmol/L (ref 98–111)
Creatinine, Ser: 0.92 mg/dL (ref 0.44–1.00)
GFR calc Af Amer: >60 mL/min (ref >60)
GFR calc non Af Amer: >60 mL/min (ref >60)
Glucose, Bld: 184 mg/dL — ABNORMAL HIGH (ref 70–99)
Potassium: 3.6 mmol/L (ref 3.5–5.1)
Sodium: 138 mmol/L (ref 135–145)

## 2020-02-24 LAB — CBC
HCT: 41.6 % (ref 36.0–46.0)
Hemoglobin: 13.7 g/dL (ref 12.0–15.0)
MCH: 29.7 pg (ref 26.0–34.0)
MCHC: 32.9 g/dL (ref 30.0–36.0)
MCV: 90.2 fL (ref 80.0–100.0)
Platelets: 379 10*3/uL (ref 150–400)
RBC: 4.61 MIL/uL (ref 3.87–5.11)
RDW: 12.8 % (ref 11.5–15.5)
WBC: 13.7 10*3/uL — ABNORMAL HIGH (ref 4.0–10.5)
nRBC: 0 % (ref 0.0–0.2)

## 2020-02-24 LAB — TROPONIN I (HIGH SENSITIVITY)
Troponin I (High Sensitivity): 3 ng/L (ref <18)
Troponin I (High Sensitivity): 3 ng/L (ref <18)

## 2020-02-24 LAB — I-STAT BETA HCG BLOOD, ED (MC, WL, AP ONLY): I-stat hCG, quantitative: <5 m[IU]/mL (ref <5)

## 2020-02-24 MED ORDER — SODIUM CHLORIDE 0.9% FLUSH: 3.0000 mL | Freq: Once | INTRAVENOUS | Status: DC

## 2020-02-24 NOTE — ED Triage Notes (Signed)
Pt arrives to ED w/ c/o chest pain over the last 2 days. Pt denies SOB, reports 2/10 pain.

## 2020-02-25 ENCOUNTER — Other Ambulatory Visit: Payer: Self-pay

## 2020-02-25 MED ORDER — ALBUTEROL SULFATE HFA 108 (90 BASE) MCG/ACT IN AERS
2.0000 | INHALATION_SPRAY | Freq: Once | RESPIRATORY_TRACT | Status: AC
  Administered 2020-02-25: 2 via RESPIRATORY_TRACT
  Filled 2020-02-25: qty 6.7

## 2020-02-25 MED ORDER — PREDNISONE 20 MG PO TABS
60.0000 mg | ORAL_TABLET | Freq: Once | ORAL | Status: AC
  Administered 2020-02-25: 60 mg via ORAL
  Filled 2020-02-25: qty 3

## 2020-02-25 MED ORDER — PREDNISONE 50 MG PO TABS: 50.0000 mg | ORAL_TABLET | Freq: Every day | ORAL | 0 refills | Status: AC

## 2020-02-25 NOTE — Discharge Instructions (Signed)
Your work-up and exam today is consistent with an asthma exacerbation causing your chest tightness, chest discomfort, and the shortness of breath.  The wheezing improved after the breathing treatment and steroids.  Steroids can increase her glucose so please monitor this closely and follow-up with your primary doctor in the next several days for further glucose management.  Please rest and stay hydrated.  If any symptoms change or worsen, please return to the nearest emergency department.

## 2020-02-25 NOTE — ED Provider Notes (Signed)
[facility] EMERGENCY DEPARTMENT Provider Note   CSN: [id number] Arrival date & time: 02/24/20  1947     History Chief Complaint  Patient presents with   Chest Pain    Barbara Cabrera is a 24 y.o. female.  The history is provided by the patient and medical records. No language interpreter was used.  Chest Pain Pain location:  Substernal area Pain quality: pressure   Pain radiates to:  Does not radiate Pain severity:  Mild Onset quality:  Gradual Duration:  4 days Timing:  Constant Progression:  Waxing and waning Chronicity:  Recurrent Context: breathing   Relieved by:  Nothing Worsened by:  Nothing Ineffective treatments:  None tried Associated symptoms: shortness of breath   Associated symptoms: no abdominal pain, no altered mental status, no back pain, no cough, no diaphoresis, no dizziness, no fatigue, no fever, no headache, no lower extremity edema, no nausea, no near-syncope, no palpitations, no vomiting and no weakness   Risk factors: diabetes mellitus   Risk factors: no birth control, no high cholesterol, no hypertension, not female, not pregnant and no prior DVT/PE        Past Medical History:  Diagnosis Date   Asthma     There are no problems to display for this patient.   History reviewed. No pertinent surgical history.   OB History   No obstetric history on file.     Family History  Problem Relation Age of Onset   Diabetes Other    Hypertension Other     Social History   Tobacco Use   Smoking status: Never Smoker   Smokeless tobacco: Never Used  Substance Use Topics   Alcohol use: No   Drug use: No    Home Medications Prior to Admission medications   Medication Sig Start Date End Date Taking? Authorizing Provider  albuterol (PROVENTIL HFA;VENTOLIN HFA) 108 (90 Base) MCG/ACT inhaler Inhale 1-2 puffs into the lungs every 6 (six) hours as needed for wheezing or shortness of breath. 05/11/18   Aviva Kluver B, PA-C    metFORMIN (GLUCOPHAGE) 1000 MG tablet Take 0.5 tablets (500 mg total) by mouth 2 (two) times daily. Patient not taking: Reported on 02/20/2020 01/01/20   Renne Crigler, PA-C  cetirizine (ZYRTEC) 10 MG tablet Take 1 tablet (10 mg total) by mouth daily. Patient not taking: Reported on 01/01/2020 10/03/18 01/01/20  Belinda Fisher, PA-C  montelukast (SINGULAIR) 10 MG tablet Take 1 tablet (10 mg total) by mouth at bedtime. Patient not taking: Reported on 01/01/2020 05/11/18 01/01/20  Elisha Ponder, PA-C    Allergies    Patient has no known allergies.  Review of Systems   Review of Systems  Constitutional: Negative for chills, diaphoresis, fatigue and fever.  HENT: Negative for congestion.   Respiratory: Positive for chest tightness, shortness of breath and wheezing. Negative for cough and stridor.   Cardiovascular: Positive for chest pain. Negative for palpitations, leg swelling and near-syncope.  Gastrointestinal: Negative for abdominal pain, constipation, diarrhea, nausea and vomiting.  Genitourinary: Negative for dysuria.  Musculoskeletal: Negative for back pain and neck pain.  Neurological: Negative for dizziness, weakness, light-headedness and headaches.  Psychiatric/Behavioral: Negative for agitation.  All other systems reviewed and are negative.   Physical Exam Updated Vital Signs BP 125/78 (BP Location: Right Arm)    Pulse 65    Temp 97.9 F (36.6 C) (Oral)    Resp 17    Ht 5\' 9"  (1.753 m)    Wt 102.1 kg  SpO2 99%    BMI 33.23 kg/m   Physical Exam Vitals and nursing note reviewed.  Constitutional:      General: She is not in acute distress.    Appearance: She is not ill-appearing, toxic-appearing or diaphoretic.  HENT:     Head: Normocephalic.  Eyes:     Pupils: Pupils are equal, round, and reactive to light.  Cardiovascular:     Rate and Rhythm: Normal rate and regular rhythm.     Heart sounds: Normal heart sounds. No murmur. No systolic murmur.  Pulmonary:     Effort:  Pulmonary effort is normal. No tachypnea.     Breath sounds: Wheezing present. No decreased breath sounds, rhonchi or rales.  Chest:     Chest wall: Tenderness present.  Abdominal:     General: Bowel sounds are normal.     Palpations: Abdomen is soft.  Musculoskeletal:        General: Normal range of motion.     Right lower leg: No tenderness. No edema.     Left lower leg: No tenderness. No edema.  Skin:    General: Skin is warm.     Capillary Refill: Capillary refill takes less than 2 seconds.     Findings: No erythema.  Neurological:     General: No focal deficit present.     Mental Status: She is alert.     ED Results / Procedures / Treatments   Labs (all labs ordered are listed, but only abnormal results are displayed) Labs Reviewed  BASIC METABOLIC PANEL - Abnormal; Notable for the following components:      Result Value   Glucose, Bld 184 (*)    BUN 25 (*)    All other components within normal limits  CBC - Abnormal; Notable for the following components:   WBC 13.7 (*)    All other components within normal limits  I-STAT BETA HCG BLOOD, ED ([facility], [facility], [facility] ONLY)  TROPONIN I (HIGH SENSITIVITY)  TROPONIN I (HIGH SENSITIVITY)    EKG EKG Interpretation  Date/Time:  Monday February 24 2020 19:53:31 EDT Ventricular Rate:  88 PR Interval:  154 QRS Duration: 82 QT Interval:  346 QTC Calculation: 418 R Axis:   68 Text Interpretation: Normal sinus rhythm ST & T wave abnormality, consider anterior ischemia , more pronounced than prior Abnormal ECG Confirmed by Thayer Jew [phone]) on 02/25/2020 6:39:05 AM   Radiology DG Chest 2 View  Result Date: 02/24/2020 CLINICAL DATA:  24 year old female with chest pain for 2 days. EXAM: CHEST - 2 VIEW COMPARISON:  Portable chest 01/01/2020. Chest radiographs 05/11/2018 and earlier. FINDINGS: Lung volumes and mediastinal contours are stable and within normal limits. Visualized tracheal air column is within normal limits. Mild hair  artifact at the thoracic inlet more so the left. Both lungs appear stable and clear. No pneumothorax or pleural effusion. Negative visible bowel gas pattern and osseous structures. IMPRESSION: Negative.  No cardiopulmonary abnormality. Electronically Signed   By: Genevie Ann M.D.   On: 02/24/2020 20:28    Procedures Procedures (including critical care time)  Medications Ordered in ED Medications  sodium chloride flush (NS) 0.9 % injection 3 mL (has no administration in time range)  albuterol (VENTOLIN HFA) 108 (90 Base) MCG/ACT inhaler 2 puff (2 puffs Inhalation Given 02/25/20 0750)  predniSONE (DELTASONE) tablet 60 mg (60 mg Oral Given 02/25/20 0749)    ED Course  I have reviewed the triage vital signs and the nursing notes.  Pertinent labs &  imaging results that were available during my care of the patient were reviewed by me and considered in my medical decision making (see chart for details).    MDM Rules/Calculators/A&P                      Barbara Cabrera is a 24 y.o. female with a past medical history significant for asthma, recent diagnosis of diabetes, and coronavirus infection last month who presents with recurrent chest discomfort and shortness of breath.  Patient reports that for the last few days she has been having the symptoms.  She was seen in the emergency department several days ago and had reassuring work-up.  She reports that the pain continued overnight into today.  She reports has been using her inhaler for wheezing but is having chest discomfort and tightness across her anterior upper chest.  She reports it is worse with coughing and movement and deep breathing.  She denies worse with exertion.  She reports no fevers or chills.  She reports continued cough.  No nausea, vomiting, urinary symptoms or GI symptoms.  She denies any new leg swelling.  No history of DVT or PE.  No recent trauma otherwise.  She has been waiting the emergency department for approximately 11 hours and 40  minutes prior to my evaluation.  Patient already had a work-up initiated in triage during this time.  On exam, lungs have some faint wheezing but no rales or rhonchi.  Chest is tender to palpation.  Good pulses in upper extremities.  Good pulses in lower extremities.  Normal strength and sensation in extremities.  No lower extremity tenderness or edema seen.  Abdomen nontender.  Back nontender.  Flanks nontender.  Patient otherwise well-appearing.  Review of patient's initial EKG showed some T wave inversions that appear more pronounced on several days ago but does have an appearance similar to more remote EKGs.  We will recheck it now that it has been 12 hours since arrival to the ED.  Her chart review shows that she has a troponin x2.  Chest x-ray was reassuring.  Lab work otherwise reassuring.  We discussed adding a D-dimer but she does not want to wait that long and she is PERC negative.  Patient also reports that she has had increase in stress recently.  I suspect this is all contributing including a asthma exacerbation with wheezing, interchanges, and pollen in the environment.  Patient be given 2 puffs of albuterol and will be given a dose of prednisone.  We discussed close monitoring of her glucose since she was recently diagnosed with diabetes and she is now on Metformin for this.  She reports she will follow-up with her PCP in the next several days and watch her glucoses given the addition of prednisone for the asthma exacerbation.  Anticipate discharge after a short while of monitoring and repeat EKG.  9:01 AM Repeat EKG appeared similar to prior and even improved.  No STEMI.  No worsened concerning findings at this time.  Reassessment of the patient also revealed improved breathing with less wheezing after steroids and albuterol.  After discussion with patient and we agreed for discharge home.  We do want her to closely monitor her glucose and follow-up with her PCP for further glucose  management as we are initiating steroids.  We discussed this.  We also discussed follow-up and her strict return precautions for any new or worsened symptoms.  Patient agrees with plan and will be discharged home for  outpatient management of likely asthma exacerbation causing chest discomfort and shortness of breath.   Final Clinical Impression(s) / ED Diagnoses Final diagnoses:  Atypical chest pain  Exacerbation of asthma, unspecified asthma severity, unspecified whether persistent    Rx / DC Orders ED Discharge Orders         Ordered    predniSONE (DELTASONE) 50 MG tablet  Daily     02/25/20 0903          Clinical Impression: 1. Atypical chest pain   2. Exacerbation of asthma, unspecified asthma severity, unspecified whether persistent     Disposition: Discharge  Condition: Good  I have discussed the results, Dx and Tx plan with the pt(& family if present). He/she/they expressed understanding and agree(s) with the plan. Discharge instructions discussed at great length. Strict return precautions discussed and pt &/or family have verbalized understanding of the instructions. No further questions at time of discharge.    New Prescriptions   PREDNISONE (DELTASONE) 50 MG TABLET    Take 1 tablet (50 mg total) by mouth daily for 4 days.    Follow Up: Leilani Able, MD [address] [place] [address] [phone]     [facility] EMERGENCY DEPARTMENT [address] [id number] mc [place] [address] [phone]       Arlyn Buerkle, Canary Brim, MD 02/25/20 [phone]

## 2020-02-25 NOTE — ED Notes (Signed)
Patient given discharge instructions. Questions were answered. Medications were reviewed. Patient verbalized understanding.

## 2020-02-28 ENCOUNTER — Encounter (HOSPITAL_COMMUNITY): Payer: Self-pay

## 2020-02-28 ENCOUNTER — Other Ambulatory Visit: Payer: Self-pay

## 2020-02-28 ENCOUNTER — Emergency Department (HOSPITAL_COMMUNITY)
Admission: EM | Admit: 2020-02-28 | Discharge: 2020-02-28 | Disposition: A | Discharge status: Home / Self Care | Payer: Self-pay | Attending: Emergency Medicine | Admitting: Emergency Medicine

## 2020-02-28 DIAGNOSIS — J45909 Unspecified asthma, uncomplicated: Secondary | ICD-10-CM | POA: Insufficient documentation

## 2020-02-28 DIAGNOSIS — Z8616 Personal history of COVID-19: Secondary | ICD-10-CM | POA: Insufficient documentation

## 2020-02-28 DIAGNOSIS — F419 Anxiety disorder, unspecified: Secondary | ICD-10-CM | POA: Insufficient documentation

## 2020-02-28 NOTE — ED Triage Notes (Signed)
Pt tested positive for COVID on3/15, states she was walking today and started to feel more SOB, pt then got really anxious. Resp e.u at this time, pt does seems anxious in triage.

## 2020-02-28 NOTE — ED Notes (Signed)
Pt ambulated on pulse ox and remained at 100% with no distress noted.

## 2020-02-28 NOTE — Discharge Instructions (Addendum)
Substance Abuse Treatment Programs ° °Intensive Outpatient Programs °[facility]     °[address]      °[place], [place]                   °[phone]      ° °[facility] °[address] °[place], [place] °[phone] ° °[facility]     °(Inpatient and outpatient)     °[address]           °[phone]   ° °[facility] °[phone] (Suboxone and Methadone) ° °[address]      °[place], [place] [address]      °336-882-2125      ° °[address] °[place], [place] °852-3033 ° °[facility] (Outpatient/Inpatient, Chemical)    °(insurance only) [phone]      °       °[facility] (Groups & Residential) °[place], [place] °[phone] ° °   °[facility]     °[address]     °[place], [place]      °[phone]      ° °[facility] (for caregivers and family) °[address] °[place], [place] °336-299-4655 ° ° ° ° ° °Residential Treatment Programs °[facility]      °[address], [place], [place] [address]  °([phone]      ° °[facility] °[address] [place], [place] [address] °[phone] ° °[facility]        °336-248-8914      ° °[facility] °1-800-659-3381 ° °[facility]             °[address]                                         °[place], [place]                                                °877-615-2722 or 336-784-9470                              ° °[facility] [place] °[address] °[place], [address] °1.877.941.8954 ° °[facility]    °[address]      °[place], [place]     °[phone]      ° °[facility] °[address] °[place], [place] °336-285-9073 ° °[facility]   °[address]     °[place], [place] [address]     °[phone]      °Admissions: 8am-3pm M-F ° °[facility] °[address] °[place] °336-227-7417 ° °[facility]: Residential Program (90 Days)   °[place], [place]      °336-725-8389 or 800-758-6077    ° °[facility] °[place], [place] °(Walk in Hours over the weekend or by referral) ° °[facility] °[address], [place], [place] [address] °([phone] ° °Crisis Mobile: [facility]:  1-877-626-1772 (for crisis response 24 hours a day) °[facility] Hotline:      1-800-256-2452 °Outpatient Psychiatry and Counseling ° °[facility]: Mobile Crisis   Management 24 hours:  1-877-626-1772 ° °[facility] sliding scale fee and walk in schedule: M-F 8am-12pm/1pm-3pm °[address]  °[place], [place] [address] °[phone] ° °[facility] °[address] °[place], [place] [address] °336-703-9650 ° °[facility] ([facility])- new patient walk-in appointments available Monday - Friday 8am -3pm.          °[address] °[place], [place] [address] °336-676-6840 or crisis line- 336-676-6905 ° °[facility] Intensive Outpatient Therapy Program °[address] °[place], [place] [address] °336-832-9804 ° °[facility]                  °Crisis Services      °[phone]      °[address]     °[place], [place] [address]                ° °[facility]   °[facility] °[phone] °[address] °[place], [place] [address] ° ° °[facility]          °[address],  °[place], [place] [address]       °([phone] ° °[facility] °[address] °[place], [place] [address] °336-292-1510 ° °[facility]    °[address]    °[place], [place] [address]     °336-632-3505      ° °Parish McKinney, MD     °[address]     °[place], [place] [address]     °336-282-1251     °  °[facility] °[address] °[place] [address] ° °[facility]     °[address]     °[place], [place]      °336-542-2076      ° °[facility] °Shamsher Ahluwalia, MD °[address] °[place], [place] [address] °[phone] ° °[facility]     °[address]     °[place], [place] [address]     °336-274-1237      ° °[facility] °[address] °[place], [place] [address] °336-854-4450 °Resources for Temporary Residential Assistance/Crisis Centers ° °DAY CENTERS °[facility] °M-F 8am-3pm   °[address] [place], [place] [address]   336-332-0824 °Services include: laundry, barbering, support groups, case management, phone  & computer access, showers, AA/NA mtgs, mental health/substance abuse nurse, job skills class, disability information, VA assistance, spiritual classes, etc.  ° °HOMELESS SHELTERS ° ° [facility]     °[facility]   °[address], [place]     °336.271.5959       °       °[facility] (women and children)       °[address] °, [place] [address] °336-275-0820 °[email or web address] for application and process °Application Required ° °[facility]   °[address]    °[place] [address]     °[phone]       °             °[facility] °[address] °, [place] [address] °336.273.5572 °336-235-0363(schedule application appt.) °Application Required ° °[facility] (women only)    °[address]     °[place],  [address]     °336-884-1039      °  Intake starts 6pm daily °Need valid ID, SSC, & Police report °[facility] [place] °[address] °[place], [place] °336-881-5420 °Application Required ° °[facility] (men only)     °[address]      °[place], [place]     °[phone]      ° °[facility] °(Pregnant women only) °[address] °[place], [place] °336-275-0206 ° °[facility]      °[address]      °[place], [place] [address]     °[phone]      °       °[facility] °[address] °[place], [place] °336-723-1848 °90 day commitment/SA/Application process ° °[facility](men only)     °[address]     °[place], [place]     °[phone]       °Check-in at 7pm     °       °[facility] °[address] °[place], [place] [address] °336-248-6684 °Men/Women/Women and Children must be there by 7 pm ° °[facility] °[place], [place] °336-722-8721                ° °

## 2020-02-28 NOTE — ED Provider Notes (Signed)
[facility] EMERGENCY DEPARTMENT Provider Note   CSN: [id number] Arrival date & time: 02/28/20  1009     History Chief Complaint  Patient presents with  . COVID+  . Shortness of Breath    Barbara Cabrera is a 24 y.o. female with past medical history significant for asthma, Covid positive on 02/03/2020 presents to emergency department today with chief complaint of intermittent shortness of breath x2 weeks.  Patient states she is currently going through multiple life stressors.  She states her shortness of breath started today after she was talking to her friend.  She states she had unintentionally caused her friend to be upset and this was very bothersome to her.  She felt tingling sensation in bilateral arms and that her heart was going to beat out of her chest.  She states the symptoms lasted for approximately 5 minutes and then resolved.  She has had similar episodes to this since symptom onset.  She states she has a history of anxiety but has never taken any medications for this.  No medications for symptoms prior to arrival today.  She does state she had Covid last month and that her symptoms have completely resolved since then.  She has not had to use her albuterol inhaler more than the usual amount.  She denies any fever, chills, chest pain, wheezing, abdominal pain, neck pain, nausea, vomiting, urinary symptoms, diarrhea.  She denies suicidal or homicidal ideations.  History provided by patient with additional history obtained from chart review.     Past Medical History:  Diagnosis Date  . Asthma     There are no problems to display for this patient.   History reviewed. No pertinent surgical history.   OB History   No obstetric history on file.     Family History  Problem Relation Age of Onset  . Diabetes Other   . Hypertension Other     Social History   Tobacco Use  . Smoking status: Never Smoker  . Smokeless tobacco: Never Used  Substance Use  Topics  . Alcohol use: No  . Drug use: No    Home Medications Prior to Admission medications   Medication Sig Start Date End Date Taking? Authorizing Provider  albuterol (PROVENTIL HFA;VENTOLIN HFA) 108 (90 Base) MCG/ACT inhaler Inhale 1-2 puffs into the lungs every 6 (six) hours as needed for wheezing or shortness of breath. 05/11/18   Langston Masker B, PA-C  metFORMIN (GLUCOPHAGE) 1000 MG tablet Take 0.5 tablets (500 mg total) by mouth 2 (two) times daily. Patient not taking: Reported on 02/20/2020 01/01/20   Carlisle Cater, PA-C  predniSONE (DELTASONE) 50 MG tablet Take 1 tablet (50 mg total) by mouth daily for 4 days. 02/26/20 03/01/20  Tegeler, Gwenyth Allegra, MD  cetirizine (ZYRTEC) 10 MG tablet Take 1 tablet (10 mg total) by mouth daily. Patient not taking: Reported on 01/01/2020 10/03/18 01/01/20  Ok Edwards, PA-C  montelukast (SINGULAIR) 10 MG tablet Take 1 tablet (10 mg total) by mouth at bedtime. Patient not taking: Reported on 01/01/2020 05/11/18 01/01/20  Albesa Seen, PA-C    Allergies    Patient has no known allergies.  Review of Systems   Review of Systems  All other systems are reviewed and are negative for acute change except as noted in the HPI.   Physical Exam Updated Vital Signs BP 118/82   Pulse 92   Temp 98 F (36.7 C) (Oral)   Resp 17   SpO2 100%  Physical Exam Vitals and nursing note reviewed.  Constitutional:      Appearance: She is well-developed. She is not ill-appearing or toxic-appearing.  HENT:     Head: Normocephalic and atraumatic.     Nose: Nose normal.  Eyes:     General: No scleral icterus.       Right eye: No discharge.        Left eye: No discharge.     Conjunctiva/sclera: Conjunctivae normal.  Neck:     Vascular: No JVD.  Cardiovascular:     Rate and Rhythm: Normal rate and regular rhythm.     Pulses: Normal pulses.     Heart sounds: Normal heart sounds.  Pulmonary:     Effort: Pulmonary effort is normal.     Breath sounds: Normal  breath sounds.     Comments: Lungs are clear to auscultation all fields.  Normal work of breathing.  Oxygen saturation is 100% on room air. No wheezing, rales, or rhonchi. Symmetric chest rise. Chest:     Chest wall: No tenderness.  Abdominal:     General: There is no distension.  Musculoskeletal:        General: Normal range of motion.     Cervical back: Normal range of motion.     Right lower leg: No edema.     Left lower leg: No edema.  Skin:    General: Skin is warm and dry.  Neurological:     Mental Status: She is oriented to person, place, and time.     GCS: GCS eye subscore is 4. GCS verbal subscore is 5. GCS motor subscore is 6.     Comments: Fluent speech, no facial droop.  Psychiatric:        Mood and Affect: Mood is anxious.        Behavior: Behavior normal.        Thought Content: Thought content normal. Thought content does not include homicidal or suicidal ideation.        Judgment: Judgment normal.     ED Results / Procedures / Treatments   Labs (all labs ordered are listed, but only abnormal results are displayed) Labs Reviewed - No data to display  EKG EKG Interpretation  Date/Time:  Friday February 28 2020 11:42:40 EDT Ventricular Rate:  88 PR Interval:    QRS Duration: 92 QT Interval:  332 QTC Calculation: 402 R Axis:   43 Text Interpretation: Sinus rhythm Baseline wander in lead(s) II Confirmed by Lorre Nick ([id number]) on 02/28/2020 11:48:35 AM   Radiology No results found.  Procedures Procedures (including critical care time)  Medications Ordered in ED Medications - No data to display  ED Course  I have reviewed the triage vital signs and the nursing notes.  Pertinent labs & imaging results that were available during my care of the patient were reviewed by me and considered in my medical decision making (see chart for details).    MDM Rules/Calculators/A&P                      Patient seen and examined. Patient presents awake, alert,  hemodynamically stable, afebrile, non toxic.  No tachycardia or hypoxia.  Patient appears very anxious.  Her lungs are clear to auscultation all fields, no wheezing, rales or rhonchi.  No abdominal tenderness.  Exam is overall benign.  EKG was performed in triage and shows sinus rhythm.  Patient ambulated in the emergency department without respiratory distress, tachycardia, or hypoxia. SpO2 during  ambulation >94% on room air.  This does not appear to be an asthma exacerbation and her reassuring exam. I discussed continuing work-up with chest x-ray and pregnancy test however patient is requesting to be discharged because her symptoms resolved when she was able to calm down. Doubt need for further emergent work up at this time. I explained the diagnosis and have given explicit precautions to return to the ER including for any other new or worsening symptoms. She declines needs for medication for her anxiety. The patient understands and accepts the medical plan as it's been dictated and I have answered their questions. Discharge instructions concerning home care and prescriptions have been given. The patient is STABLE and is discharged to home in good condition.  Resource list given for behavioral health services.   Portions of this note were generated with Scientist, clinical (histocompatibility and immunogenetics). Dictation errors may occur despite best attempts at proofreading.   Final Clinical Impression(s) / ED Diagnoses Final diagnoses:  Anxiety    Rx / DC Orders ED Discharge Orders    None       Kathyrn Lass 02/28/20 1221    Lorre Nick, MD 03/01/20 1715

## 2020-06-14 IMAGING — DX DG CHEST 2V
2 series · 2 of 2 positions shown · non-contrast
Comparison: Portable chest 01/01/2020. Chest radiographs 05/11/2018
and earlier.

CLINICAL DATA: 23-year-old female with chest pain for 2 days.

EXAM:
CHEST - 2 VIEW

[chest pa]
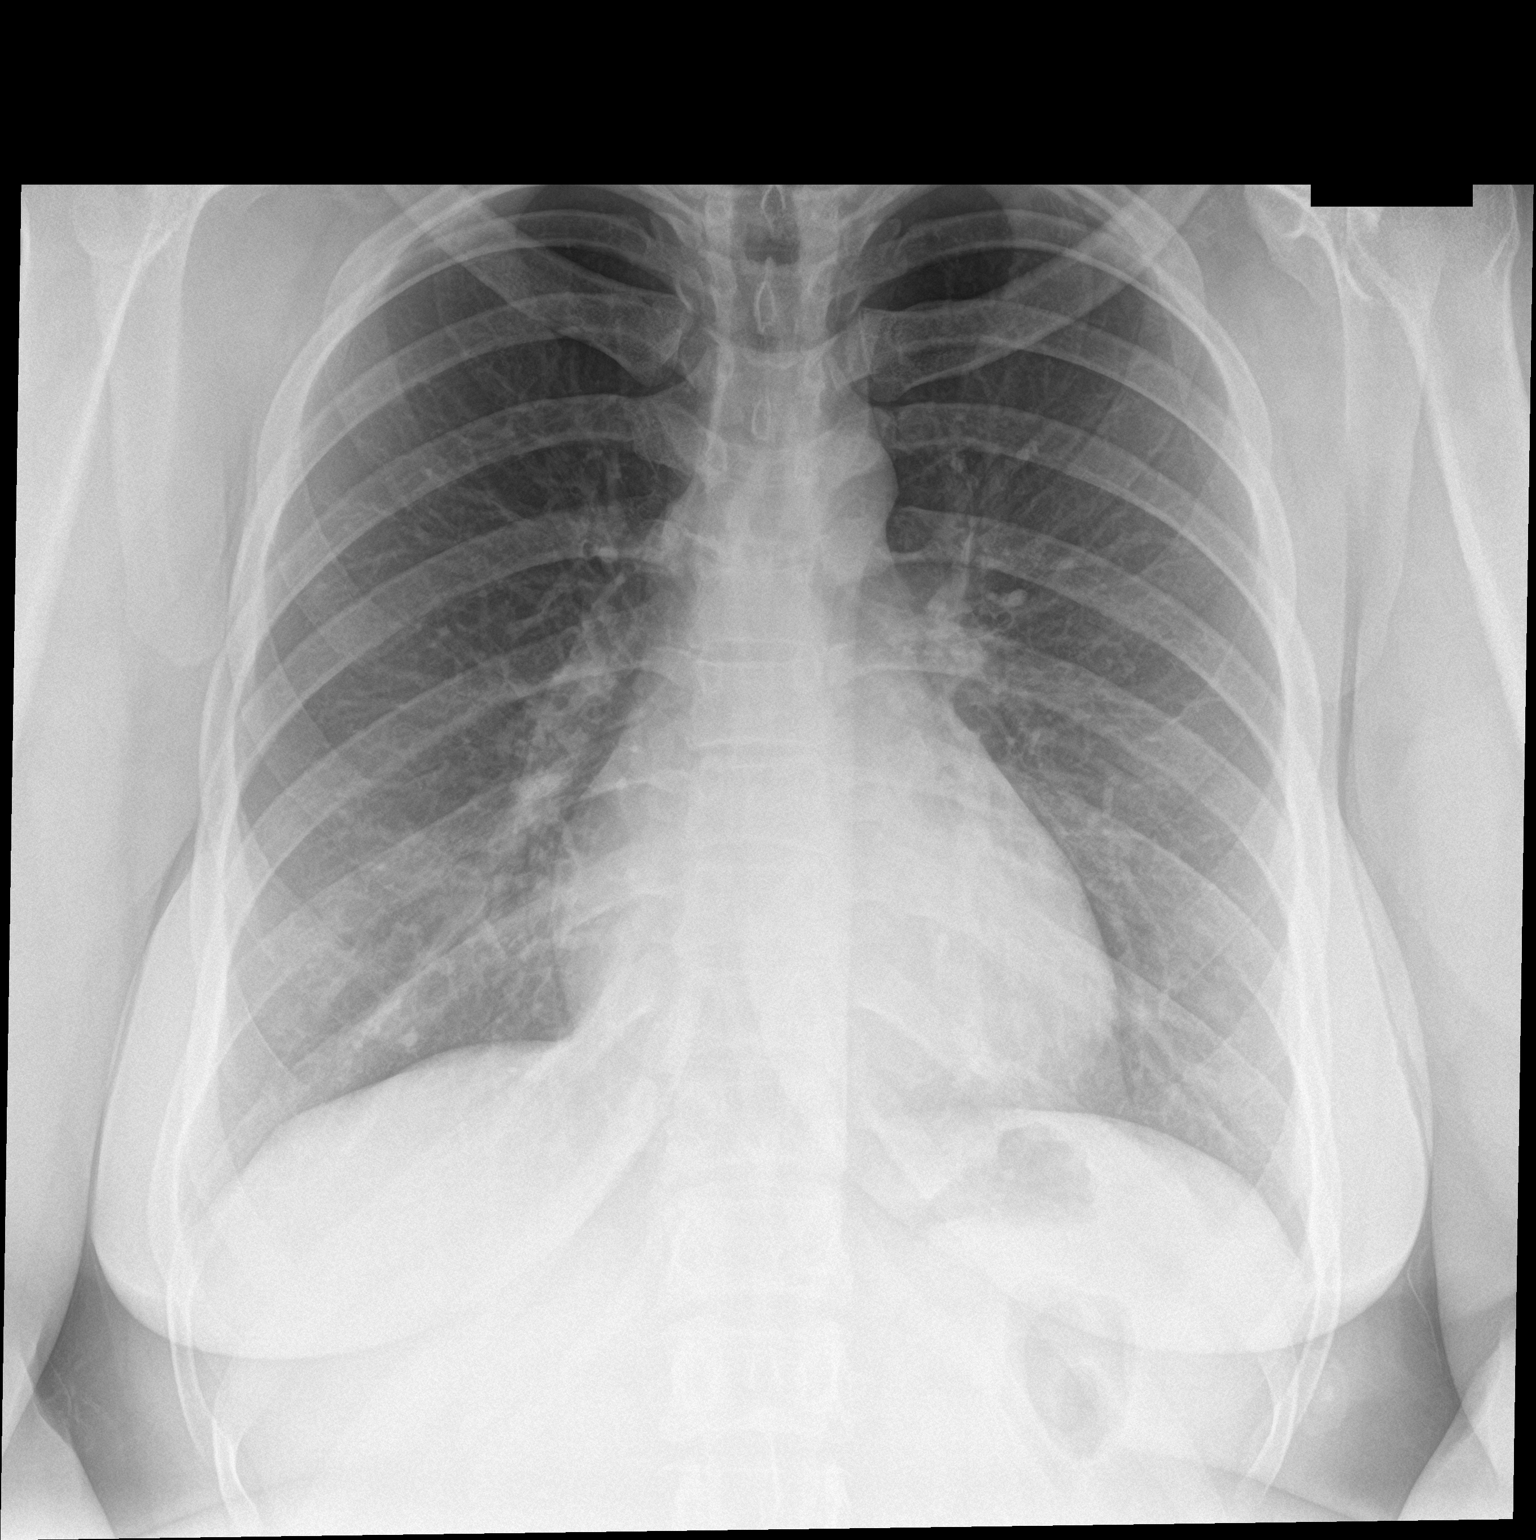

[chest lat]
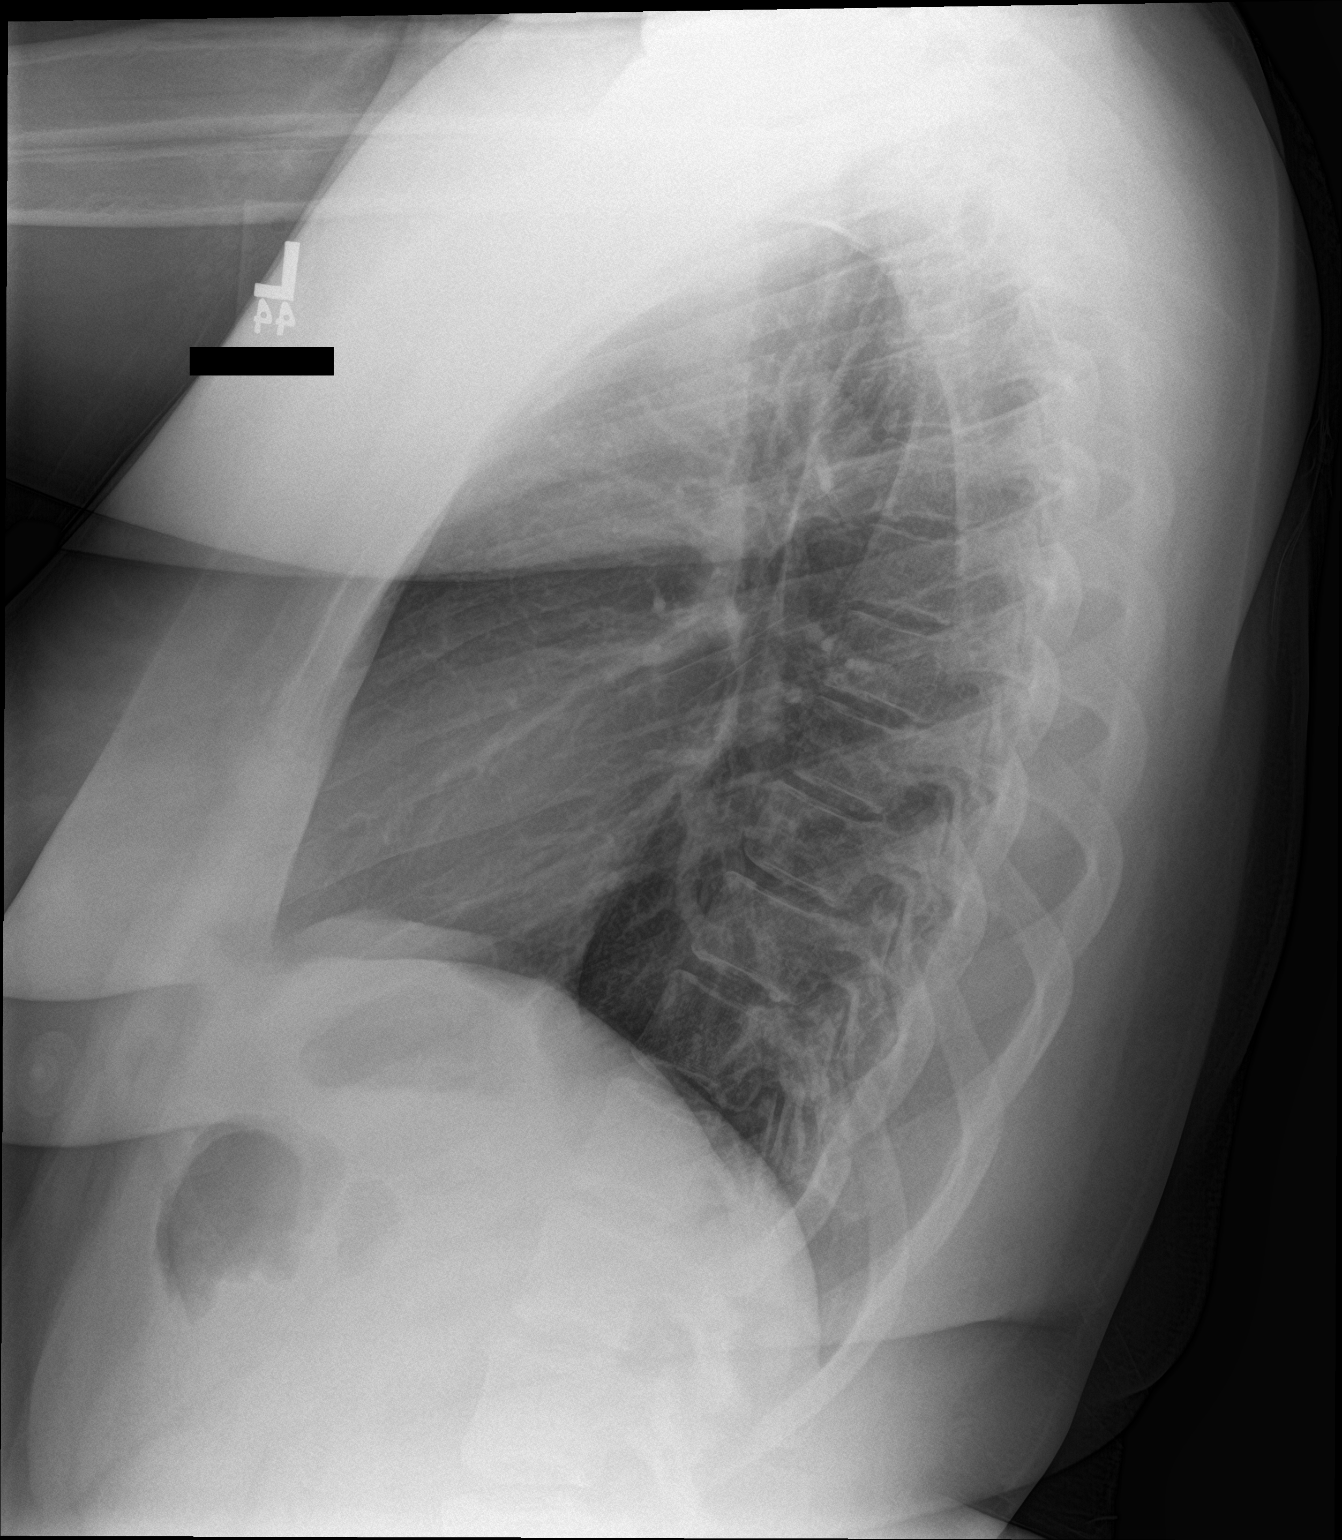

[2 of 2 positions shown; findings below may reference images not displayed]

FINDINGS: Lung volumes and mediastinal contours are stable and within normal
limits. Visualized tracheal air column is within normal limits. Mild
hair artifact at the thoracic inlet more so the left. Both lungs
appear stable and clear. No pneumothorax or pleural effusion.

Negative visible bowel gas pattern and osseous structures.
IMPRESSION: Negative.  No cardiopulmonary abnormality.

## 2020-10-09 ENCOUNTER — Encounter (HOSPITAL_COMMUNITY): Payer: Self-pay

## 2020-10-09 ENCOUNTER — Other Ambulatory Visit: Payer: Self-pay

## 2020-10-09 ENCOUNTER — Emergency Department (HOSPITAL_COMMUNITY)
Admission: EM | Admit: 2020-10-09 | Discharge: 2020-10-09 | Disposition: A | Discharge status: Home / Self Care | Payer: Self-pay | Attending: Emergency Medicine | Admitting: Emergency Medicine

## 2020-10-09 DIAGNOSIS — F419 Anxiety disorder, unspecified: Secondary | ICD-10-CM | POA: Insufficient documentation

## 2020-10-09 DIAGNOSIS — J45909 Unspecified asthma, uncomplicated: Secondary | ICD-10-CM | POA: Insufficient documentation

## 2020-10-09 MED ORDER — HYDROXYZINE HCL 25 MG PO TABS: 25.0000 mg | ORAL_TABLET | Freq: Four times a day (QID) | ORAL | 0 refills | Status: DC | PRN

## 2020-10-09 NOTE — Discharge Instructions (Addendum)
You can take the hydroxyzine every 6 hours as needed for anxiety.  If symptoms worsen, please don't hesitate to report to the behavioral health urgent care for additional assistance.

## 2020-10-09 NOTE — ED Triage Notes (Signed)
Pt BIB EMS from home. Pt reports she has been under a lot of stress and has been feeling anxious for the past few hours.  139/80 98% RA HR 88

## 2020-10-09 NOTE — ED Provider Notes (Signed)
[facility]-EMERGENCY DEPT Provider Note   CSN: [id number] Arrival date & time: 10/09/20  1351     History Chief Complaint  Patient presents with  . Anxiety    Barbara Cabrera is a 24 y.o. female w PMHx asthma, anxiety, presenting to the ED with complaint of anxiety. Patient states she became emotionally overwhelmed earlier today while speaking with a family member on the phone. She states she has been crying a bit and having lots of anxiety today. Denies SI or HI. States she does not constantly deal with anxiety, only intermittently. Used to see counseler, thinking about restarting. No medications tried. No other complaints.  The history is provided by the patient.       Past Medical History:  Diagnosis Date  . Asthma     There are no problems to display for this patient.   History reviewed. No pertinent surgical history.   OB History   No obstetric history on file.     Family History  Problem Relation Age of Onset  . Diabetes Other   . Hypertension Other     Social History   Tobacco Use  . Smoking status: Never Smoker  . Smokeless tobacco: Never Used  Substance Use Topics  . Alcohol use: No  . Drug use: No    Home Medications Prior to Admission medications   Medication Sig Start Date End Date Taking? Authorizing Provider  albuterol (PROVENTIL HFA;VENTOLIN HFA) 108 (90 Base) MCG/ACT inhaler Inhale 1-2 puffs into the lungs every 6 (six) hours as needed for wheezing or shortness of breath. 05/11/18   Aviva Kluver B, PA-C  hydrOXYzine (ATARAX/VISTARIL) 25 MG tablet Take 1 tablet (25 mg total) by mouth every 6 (six) hours as needed for anxiety. 10/09/20   Phillips Goulette, [place] N, PA-C  metFORMIN (GLUCOPHAGE) 1000 MG tablet Take 0.5 tablets (500 mg total) by mouth 2 (two) times daily. Patient not taking: Reported on 02/20/2020 01/01/20   Renne Crigler, PA-C  cetirizine (ZYRTEC) 10 MG tablet Take 1 tablet (10 mg total) by mouth daily. Patient not  taking: Reported on 01/01/2020 10/03/18 01/01/20  Belinda Fisher, PA-C  montelukast (SINGULAIR) 10 MG tablet Take 1 tablet (10 mg total) by mouth at bedtime. Patient not taking: Reported on 01/01/2020 05/11/18 01/01/20  Elisha Ponder, PA-C    Allergies    Patient has no known allergies.  Review of Systems   Review of Systems  Psychiatric/Behavioral: The patient is nervous/anxious.   All other systems reviewed and are negative.   Physical Exam Updated Vital Signs BP (!) 133/96 (BP Location: Left Arm)   Pulse 85   Temp 98 F (36.7 C) (Oral)   Resp 16   SpO2 100%   Physical Exam Vitals and nursing note reviewed.  Constitutional:      General: She is not in acute distress.    Appearance: She is well-developed.  HENT:     Head: Normocephalic and atraumatic.  Eyes:     Conjunctiva/sclera: Conjunctivae normal.  Cardiovascular:     Rate and Rhythm: Normal rate.  Pulmonary:     Effort: Pulmonary effort is normal.  Abdominal:     Palpations: Abdomen is soft.  Skin:    General: Skin is warm.  Neurological:     Mental Status: She is alert.  Psychiatric:        Attention and Perception: Attention normal.        Mood and Affect: Affect normal. Mood is anxious.  Speech: Speech normal.        Behavior: Behavior normal. Behavior is cooperative.     ED Results / Procedures / Treatments   Labs (all labs ordered are listed, but only abnormal results are displayed) Labs Reviewed - No data to display  EKG None  Radiology No results found.  Procedures Procedures (including critical care time)  Medications Ordered in ED Medications - No data to display  ED Course  I have reviewed the triage vital signs and the nursing notes.  Pertinent labs & imaging results that were available during my care of the patient were reviewed by me and considered in my medical decision making (see chart for details).    MDM Rules/Calculators/A&P                          Patient  presenting for anxiety that began today.  States she became emotionally overwhelmed after talking the phone with her family member is having worsening anxiety.  Denies SI or HI.  She appears mildly anxious on exam though no significant distress. VSS. Patient states she is ready to go home.  She has never treated with medicines in the past that she states, however she is agreeable with prescription for hydroxyzine.  Declines any in the ED.  Provided information regarding behavioral health urgent care and return precautions.  Patient verbalized understanding and appears appropriate for discharge.   Final Clinical Impression(s) / ED Diagnoses Final diagnoses:  Anxiety    Rx / DC Orders ED Discharge Orders         Ordered    hydrOXYzine (ATARAX/VISTARIL) 25 MG tablet  Every 6 hours PRN        10/09/20 1500           Zachary Lovins, [place] N, [place] 10/09/20 1513    Derwood Kaplan, MD 10/09/20 1929

## 2021-03-07 ENCOUNTER — Emergency Department (HOSPITAL_COMMUNITY)
Admission: EM | Admit: 2021-03-07 | Discharge: 2021-03-07 | Disposition: A | Discharge status: Home / Self Care | Payer: Self-pay | Attending: Emergency Medicine | Admitting: Emergency Medicine

## 2021-03-07 ENCOUNTER — Other Ambulatory Visit: Payer: Self-pay

## 2021-03-07 DIAGNOSIS — Z23 Encounter for immunization: Secondary | ICD-10-CM | POA: Insufficient documentation

## 2021-03-07 DIAGNOSIS — L03011 Cellulitis of right finger: Secondary | ICD-10-CM | POA: Insufficient documentation

## 2021-03-07 DIAGNOSIS — J45909 Unspecified asthma, uncomplicated: Secondary | ICD-10-CM | POA: Insufficient documentation

## 2021-03-07 MED ORDER — IBUPROFEN 400 MG PO TABS
600.0000 mg | ORAL_TABLET | Freq: Once | ORAL | Status: AC
  Administered 2021-03-07: 600 mg via ORAL
  Filled 2021-03-07: qty 1

## 2021-03-07 MED ORDER — AMOXICILLIN-POT CLAVULANATE 875-125 MG PO TABS: 1.0000 | ORAL_TABLET | Freq: Two times a day (BID) | ORAL | 0 refills | Status: AC

## 2021-03-07 MED ORDER — AMOXICILLIN-POT CLAVULANATE 875-125 MG PO TABS
1.0000 | ORAL_TABLET | Freq: Once | ORAL | Status: AC
  Administered 2021-03-07: 1 via ORAL
  Filled 2021-03-07: qty 1

## 2021-03-07 MED ORDER — PENTAFLUOROPROP-TETRAFLUOROETH EX AERO
INHALATION_SPRAY | Freq: Once | CUTANEOUS | Status: DC
  Filled 2021-03-07: qty 116

## 2021-03-07 MED ORDER — ACETAMINOPHEN 325 MG PO TABS
650.0000 mg | ORAL_TABLET | Freq: Once | ORAL | Status: AC
  Administered 2021-03-07: 650 mg via ORAL
  Filled 2021-03-07: qty 2

## 2021-03-07 MED ORDER — SULFAMETHOXAZOLE-TRIMETHOPRIM 800-160 MG PO TABS: 1.0000 | ORAL_TABLET | Freq: Two times a day (BID) | ORAL | 0 refills | Status: AC

## 2021-03-07 MED ORDER — TETANUS-DIPHTH-ACELL PERTUSSIS 5-2.5-18.5 LF-MCG/0.5 IM SUSY
0.5000 mL | PREFILLED_SYRINGE | Freq: Once | INTRAMUSCULAR | Status: AC
  Administered 2021-03-07: 0.5 mL via INTRAMUSCULAR
  Filled 2021-03-07: qty 0.5

## 2021-03-07 NOTE — ED Triage Notes (Signed)
Pt arrives to ED by POV due to Finger Swelling. Pt's Rt middle finger is swollen and red. Pt states swelling has been going on since yesterday and worsened today. Rates pain 9/10.

## 2021-03-07 NOTE — Discharge Instructions (Addendum)
Please take Ibuprofen (Advil, motrin) and Tylenol (acetaminophen) to relieve your pain.    You may take up to 600 MG (3 pills) of normal strength ibuprofen every 8 hours as needed.   You make take tylenol, up to 1,000 mg (two extra strength pills) every 8 hours as needed.   It is safe to take ibuprofen and tylenol at the same time as they work differently.   Do not take more than 3,000 mg tylenol in a 24 hour period (not more than one dose every 8 hours.  Please check all medication labels as many medications such as pain and cold medications may contain tylenol.  Do not drink alcohol while taking these medications.  Do not take other NSAID'S while taking ibuprofen (such as aleve or naproxen).  Please take ibuprofen with food to decrease stomach upset.  Please put a warm compress on your finger 3-4 times a day.  This will help your body fight the infection.   Since you bite the skin around your fingers I have placed you on two antibiotics.  One will cover the bacteria found in the human mouth and the other will cover MRSA.  Please stop biting on your nails.   If there is a chance you may be pregnant take a pregnancy test before starting the bactrim.    You may have diarrhea from the antibiotics.  It is very important that you continue to take the antibiotics even if you get diarrhea unless a medical professional tells you that you may stop taking them.  If you stop too early the bacteria you are being treated for will become stronger and you may need different, more powerful antibiotics that have more side effects and worsening diarrhea.  Please stay well hydrated and consider probiotics as they may decrease the severity of your diarrhea.  Please be aware that if you take any hormonal contraception (birth control pills, nexplanon, the ring, etc) that your birth control will not work while you are taking antibiotics and you need to use back up protection as directed on the birth control medication  information insert.

## 2021-03-07 NOTE — ED Notes (Signed)
All appropriate discharge materials reviewed at length with patient. Time for questions provided. Pt has no other questions at this time and verbalizes understanding of all provided materials.  

## 2021-03-07 NOTE — ED Provider Notes (Signed)
[facility] EMERGENCY DEPARTMENT Provider Note   CSN: [id number] Arrival date & time: 03/07/21  1608     History Chief Complaint  Patient presents with  . Finger Injury    Barbara Cabrera is a 25 y.o. female who presents today for evaluation of pain and swelling in her finger.  She states that she frequently bites that her fingers and nail edges.  She woke up this morning with pain and swelling in the right long finger. She denies any fevers.  No history of similar.  She otherwise feels well. She does not know when her last tetanus shot was.  HPI     Past Medical History:  Diagnosis Date  . Asthma     There are no problems to display for this patient.   No past surgical history on file.   OB History   No obstetric history on file.     Family History  Problem Relation Age of Onset  . Diabetes Other   . Hypertension Other     Social History   Tobacco Use  . Smoking status: Never Smoker  . Smokeless tobacco: Never Used  Substance Use Topics  . Alcohol use: No  . Drug use: No    Home Medications Prior to Admission medications   Medication Sig Start Date End Date Taking? Authorizing Provider  amoxicillin-clavulanate (AUGMENTIN) 875-125 MG tablet Take 1 tablet by mouth every 12 (twelve) hours for 10 days. 03/07/21 03/17/21 Yes Cristina Gong, PA-C  sulfamethoxazole-trimethoprim (BACTRIM DS) 800-160 MG tablet Take 1 tablet by mouth 2 (two) times daily for 10 days. 03/07/21 03/17/21 Yes Cristina Gong, PA-C  albuterol (PROVENTIL HFA;VENTOLIN HFA) 108 (90 Base) MCG/ACT inhaler Inhale 1-2 puffs into the lungs every 6 (six) hours as needed for wheezing or shortness of breath. 05/11/18   Aviva Kluver B, PA-C  hydrOXYzine (ATARAX/VISTARIL) 25 MG tablet Take 1 tablet (25 mg total) by mouth every 6 (six) hours as needed for anxiety. 10/09/20   Robinson, [place] N, PA-C  metFORMIN (GLUCOPHAGE) 1000 MG tablet Take 0.5 tablets (500 mg total) by mouth 2  (two) times daily. Patient not taking: Reported on 02/20/2020 01/01/20   Renne Crigler, PA-C  cetirizine (ZYRTEC) 10 MG tablet Take 1 tablet (10 mg total) by mouth daily. Patient not taking: Reported on 01/01/2020 10/03/18 01/01/20  Belinda Fisher, PA-C  montelukast (SINGULAIR) 10 MG tablet Take 1 tablet (10 mg total) by mouth at bedtime. Patient not taking: Reported on 01/01/2020 05/11/18 01/01/20  Elisha Ponder, PA-C    Allergies    Patient has no known allergies.  Review of Systems   Review of Systems  Constitutional: Negative for chills and fever.  Skin: Positive for color change and wound.  Neurological: Negative for weakness and headaches.  All other systems reviewed and are negative.   Physical Exam Updated Vital Signs BP (!) 153/88 (BP Location: Right Arm)   Pulse (!) 107   Temp 98.5 F (36.9 C) (Oral)   Resp 18   Ht 5\' 9"  (1.753 m)   Wt 101.6 kg   SpO2 100% Comment: Room Air  BMI 33.08 kg/m   Physical Exam Vitals and nursing note reviewed.  Constitutional:      General: She is not in acute distress. HENT:     Head: Normocephalic and atraumatic.  Cardiovascular:     Rate and Rhythm: Normal rate.  Pulmonary:     Effort: Pulmonary effort is normal. No respiratory distress.  Musculoskeletal:  General: No deformity.     Cervical back: No rigidity.     Comments: No crepitus of the right long finger  Skin:    Comments: There is a paronychia on the right long finger.  Mild edema around the distal finger.  No proximal red streaking.   Neurological:     Mental Status: She is alert. Mental status is at baseline.     Comments: Awake and alert, answers all questions appropriately.  Speech is not slurred.    Psychiatric:        Mood and Affect: Mood normal.      ED Results / Procedures / Treatments   Labs (all labs ordered are listed, but only abnormal results are displayed) Labs Reviewed - No data to display  EKG None  Radiology No results  found.  Procedures Drain paronychia  Date/Time: 03/07/2021 5:48 PM Performed by: Cristina Gong, PA-C Authorized by: Cristina Gong, PA-C  Consent: Verbal consent obtained. Risks and benefits: risks, benefits and alternatives were discussed Consent given by: patient Local anesthesia used: yes  Anesthesia: Local anesthesia used: yes Local anesthetic: Cold spray, ice. Patient tolerance: patient tolerated the procedure well with no immediate complications Comments: Moderate amount of purulent drainage.       Medications Ordered in ED Medications  pentafluoroprop-tetrafluoroeth (GEBAUERS) aerosol (has no administration in time range)  Tdap (BOOSTRIX) injection 0.5 mL (has no administration in time range)  amoxicillin-clavulanate (AUGMENTIN) 875-125 MG per tablet 1 tablet (has no administration in time range)  ibuprofen (ADVIL) tablet 600 mg (has no administration in time range)  acetaminophen (TYLENOL) tablet 650 mg (has no administration in time range)    ED Course  I have reviewed the triage vital signs and the nursing notes.  Pertinent labs & imaging results that were available during my care of the patient were reviewed by me and considered in my medical decision making (see chart for details).    MDM Rules/Calculators/A&P                          Patient is a 25 year old woman who presents today for evaluation of a paronychia.  Pain started this morning. She gave consent and incision and drainage was performed with a moderate amount of purulent drainage. Her tetanus is updated. As she states that she bites and chews on her fingernails frequently she will require antibiotic coverage for both oral flora and MRSA.  Patient is given rx for bactrim and Augmentin.  She is advised to stop chewing on her fingers.  Given that her symptoms started this morning doubt she has significant deep spread.  Recommended PCP follow-up for reevaluation in 2 days or back at the  ER sooner if symptoms worsen.  While in the ER patient was both hypertensive and tachycardic at points.  Her heart rate would increase significantly when I came in the room and whenever I discussed the procedure.  I suspect that her tachycardia is caused by anxiety, rather than by infection especially as she is afebrile and otherwise feels well. I recommended PCP follow-up for recheck.  Return precautions were discussed with patient who states their understanding.  At the time of discharge patient denied any unaddressed complaints or concerns.  Patient is agreeable for discharge home.  Note: Portions of this report may have been transcribed using voice recognition software. Every effort was made to ensure accuracy; however, inadvertent computerized transcription errors may be present.   Final Clinical Impression(s) /  ED Diagnoses Final diagnoses:  Paronychia of finger of right hand    Rx / DC Orders ED Discharge Orders         Ordered    amoxicillin-clavulanate (AUGMENTIN) 875-125 MG tablet  Every 12 hours        03/07/21 1745    sulfamethoxazole-trimethoprim (BACTRIM DS) 800-160 MG tablet  2 times daily        03/07/21 1745           Norman Clay 03/07/21 1752    Tilden Fossa, MD 03/07/21 2122

## 2021-11-01 ENCOUNTER — Encounter (HOSPITAL_BASED_OUTPATIENT_CLINIC_OR_DEPARTMENT_OTHER): Payer: Self-pay | Admitting: Urology

## 2021-11-01 DIAGNOSIS — F410 Panic disorder [episodic paroxysmal anxiety] without agoraphobia: Secondary | ICD-10-CM | POA: Insufficient documentation

## 2021-11-01 DIAGNOSIS — J45909 Unspecified asthma, uncomplicated: Secondary | ICD-10-CM | POA: Insufficient documentation

## 2021-11-01 DIAGNOSIS — Z7984 Long term (current) use of oral hypoglycemic drugs: Secondary | ICD-10-CM | POA: Insufficient documentation

## 2021-11-01 NOTE — ED Triage Notes (Signed)
Pt states feeling of overwhelmed, hyperventilation, states nauseated.  States going through a lot of grief, loss of loved one.

## 2021-11-02 ENCOUNTER — Emergency Department (HOSPITAL_BASED_OUTPATIENT_CLINIC_OR_DEPARTMENT_OTHER)
Admission: EM | Admit: 2021-11-02 | Discharge: 2021-11-02 | Disposition: A | Discharge status: Home / Self Care | Payer: Medicaid Other | Attending: Emergency Medicine | Admitting: Emergency Medicine

## 2021-11-02 DIAGNOSIS — F410 Panic disorder [episodic paroxysmal anxiety] without agoraphobia: Secondary | ICD-10-CM

## 2021-11-02 HISTORY — DX: Anxiety disorder, unspecified: F41.9

## 2021-11-02 LAB — URINALYSIS, MICROSCOPIC (REFLEX)

## 2021-11-02 LAB — URINALYSIS, ROUTINE W REFLEX MICROSCOPIC
Bilirubin Urine: NEGATIVE
Glucose, UA: >=500 mg/dL — AB
Hgb urine dipstick: NEGATIVE
Ketones, ur: NEGATIVE mg/dL
Leukocytes,Ua: NEGATIVE
Nitrite: NEGATIVE
Protein, ur: NEGATIVE mg/dL
Specific Gravity, Urine: 1.015 (ref 1.005–1.030)
pH: 8 (ref 5.0–8.0)

## 2021-11-02 LAB — PREGNANCY, URINE: Preg Test, Ur: NEGATIVE

## 2021-11-02 MED ORDER — HYDROXYZINE HCL 50 MG PO TABS: 50.0000 mg | ORAL_TABLET | Freq: Four times a day (QID) | ORAL | 0 refills | Status: DC | PRN

## 2021-11-02 NOTE — ED Provider Notes (Signed)
[facility] EMERGENCY DEPARTMENT Provider Note   CSN: [id number] Arrival date & time: 11/01/21  2323     History Chief Complaint  Patient presents with   Anxiety    Barbara Cabrera is a 25 y.o. female.  The history is provided by the patient.  Anxiety This is a new problem. The current episode started 1 to 2 hours ago. The problem occurs constantly. The problem has been gradually improving. Associated symptoms include shortness of breath. Pertinent negatives include no chest pain. Nothing aggravates the symptoms. The symptoms are relieved by relaxation.  Patient presents with panic attack.  She reports feeling overwhelmed, she was hyperventilating, she felt short of breath and she felt her heart racing.  She reports that her heart was racing and then abruptly slowed down into the 40s she checked it at home No Syncope   She reports significant stressors in her life including the recent loss of multiple family members. She reports sleep disturbance.  No SI Past Medical History:  Diagnosis Date   Anxiety    Asthma       OB History   No obstetric history on file.     Family History  Problem Relation Age of Onset   Diabetes Other    Hypertension Other     Social History   Tobacco Use   Smoking status: Never   Smokeless tobacco: Never  Substance Use Topics   Alcohol use: No   Drug use: No    Home Medications Prior to Admission medications   Medication Sig Start Date End Date Taking? Authorizing Provider  hydrOXYzine (ATARAX) 50 MG tablet Take 1 tablet (50 mg total) by mouth every 6 (six) hours as needed for anxiety. 11/02/21  Yes Zadie Rhine, MD  albuterol (PROVENTIL HFA;VENTOLIN HFA) 108 (90 Base) MCG/ACT inhaler Inhale 1-2 puffs into the lungs every 6 (six) hours as needed for wheezing or shortness of breath. 05/11/18   Aviva Kluver B, PA-C  metFORMIN (GLUCOPHAGE) 1000 MG tablet Take 0.5 tablets (500 mg total) by mouth 2 (two) times daily. Patient  not taking: Reported on 02/20/2020 01/01/20   Renne Crigler, PA-C  cetirizine (ZYRTEC) 10 MG tablet Take 1 tablet (10 mg total) by mouth daily. Patient not taking: Reported on 01/01/2020 10/03/18 01/01/20  Belinda Fisher, PA-C  montelukast (SINGULAIR) 10 MG tablet Take 1 tablet (10 mg total) by mouth at bedtime. Patient not taking: Reported on 01/01/2020 05/11/18 01/01/20  Elisha Ponder, PA-C    Allergies    Patient has no known allergies.  Review of Systems   Review of Systems  Constitutional:  Negative for fever.  Respiratory:  Positive for shortness of breath.   Cardiovascular:  Negative for chest pain.  Gastrointestinal:  Positive for nausea.  Neurological:  Positive for light-headedness. Negative for syncope.  Psychiatric/Behavioral:  Positive for sleep disturbance. Negative for suicidal ideas. The patient is nervous/anxious.   All other systems reviewed and are negative.  Physical Exam Updated Vital Signs BP (!) 144/94   Pulse 83   Temp 97.6 F (36.4 C) (Oral)   Resp 14   Ht 1.753 m (5\' 9" )   Wt 101.6 kg   LMP 10/27/2021   SpO2 100%   BMI 33.08 kg/m   Physical Exam CONSTITUTIONAL: Well developed/well nourished HEAD: Normocephalic/atraumatic EYES: EOMI/PERRL ENMT: Mucous membranes moist NECK: supple no meningeal signs CV: S1/S2 noted, no murmurs/rubs/gallops noted LUNGS: Lungs are clear to auscultation bilaterally, no apparent distress ABDOMEN: soft, nontender NEURO: Pt is awake/alert/appropriate,  moves all extremitiesx4.  No facial droop.   EXTREMITIES: pulses normal/equal, full ROM SKIN: warm, color normal PSYCH: Mildly anxious, but no abnormalities of mood noted, alert and oriented to situation  ED Results / Procedures / Treatments   Labs (all labs ordered are listed, but only abnormal results are displayed) Labs Reviewed  URINALYSIS, ROUTINE W REFLEX MICROSCOPIC - Abnormal; Notable for the following components:      Result Value   Color, Urine COLORLESS (*)     Glucose, UA >=500 (*)    All other components within normal limits  URINALYSIS, MICROSCOPIC (REFLEX) - Abnormal; Notable for the following components:   Bacteria, UA RARE (*)    All other components within normal limits  PREGNANCY, URINE    EKG EKG Interpretation  Date/Time:  Tuesday November 02 2021 00:57:52 EST Ventricular Rate:  79 PR Interval:  136 QRS Duration: 98 QT Interval:  368 QTC Calculation: 422 R Axis:   60 Text Interpretation: Sinus rhythm Confirmed by Zadie Rhine ([id number]) on 11/02/2021 1:03:06 AM  Radiology No results found.  Procedures Procedures   Medications Ordered in ED Medications - No data to display  ED Course  I have reviewed the triage vital signs and the nursing notes.  Pertinent labs results that were available during my care of the patient were reviewed by me and considered in my medical decision making (see chart for details).    MDM Rules/Calculators/A&P                           Patient reports she had a panic attack that is already improving.  She reports recent loss of multiple family numbers.  She is feeling improved.  On reassessment she is on the phone, smiling and laughing.  She denies SI.  She will hydroxyzine as needed.  Outpatient resources were given  Patient is safe for discharge Final Clinical Impression(s) / ED Diagnoses Final diagnoses:  Panic attack    Rx / DC Orders ED Discharge Orders          Ordered    hydrOXYzine (ATARAX) 50 MG tablet  Every 6 hours PRN        11/02/21 0104             Zadie Rhine, MD 11/02/21 0124

## 2021-11-02 NOTE — Discharge Instructions (Signed)
Substance Abuse Treatment Programs ° °Intensive Outpatient Programs °[facility]     °[address]      °[place], [place]                   °[phone]      ° °[facility] °[address] °[place], [place] °[phone] ° °[facility]     °(Inpatient and outpatient)     °[address]           °[phone]   ° °[facility] °[phone] (Suboxone and Methadone) ° °[address]      °[place] [address]      °[phone]      ° °[address] °[place], [place] °852-3033 ° °[facility] (Outpatient/Inpatient, [facility])    °(insurance only) [phone]      °       °[facility] (Groups & Residential) °[place], [place] °[phone] ° °   °[facility]     °[address]     °[place], [place]      °[phone]      ° °[facility] (for caregivers and family) °[address] °[place], [place] °336-299-4655 ° ° ° ° ° °Residential Treatment Programs °[facility]      °[address], [place], [place] [address]  °[phone]      ° °[facility] °[address] [place], [place] [address] °[phone] ° °[facility]        °[phone]      ° °[facility] °[phone] ° °[facility]             °[address]                                         °[place], [place]                                                °[phone] or [phone]                              ° °[facility] [place] °[address] °[place], [address] °[phone] ° °[facility]    °[address]      °, [place]     °[phone]      ° °[facility] °[address] °, [place] °336-285-9073 ° °[facility]   °[address]     °[place], [place] [address]     °[phone]      °Admissions: 8am-3pm M-F ° °[facility] °[address] °[place] °[phone] ° °[facility]: Residential Program (90 Days)   °[place], [place]      °336-725-8389 or 800-758-6077    ° °[facility] °[place], [place] °(Walk in Hours over the weekend or by referral) ° °[facility] °[address], [place],  [address] °([phone] ° °Crisis Mobile: [facility]:  1-877-626-1772 (for crisis response 24 hours a day) °[facility] Hotline:      1-800-256-2452 °Outpatient Psychiatry and Counseling ° °[facility]: Mobile Crisis   Management 24 hours:  1-877-626-1772 ° °[facility] sliding scale fee and walk in schedule: M-F 8am-12pm/1pm-3pm °[address]  °[place], [place] [address] °[phone] ° °[facility] °[address] °[place], [place] [address] °336-703-9650 ° °[facility] ([facility])- new patient walk-in appointments available Monday - Friday 8am -3pm.          °[address] °[place], [place] [address] °336-676-6840 or crisis line- 336-676-6905 ° °[facility]/ Intensive Outpatient Therapy Program °[address] °[place], [place] [address] °336-832-9804 ° °[facility]                  °Crisis Services      °[phone]      °[address]     °[place], [place] [address]                ° °[facility]   °[facility] °[phone] °[address] °[place], [place] [address] ° ° °[facility]          °[address]  °[place], [place] [address]       °[phone] ° °[facility] °[address] °[place], [place] [address] °336-292-1510 ° °[facility]    °[address]    °[place], [place] [address]     °[phone]      ° °Parish McKinney, MD     °[address]     °[place] [address]     °336-282-1251     °  °[facility] °[address] °[place] [address] ° °[facility]     °[address]     °[place], [place]      °336-542-2076      ° °[facility] °Shamsher Ahluwalia, MD °[address] °[place], [place] [address] °[phone] ° °[facility]     °[address]     °[place], [place] [address]     °336-274-1237      ° °[facility] °[address] °[place], [place] [address] °336-854-4450 °Resources for Temporary Residential Assistance/Crisis Centers ° °DAY CENTERS °[facility] °M-F 8am-3pm   °[address] [place], [place] [address]   336-332-0824 °Services include: laundry, barbering, support groups, case management, phone  & computer access, showers, AA/NA mtgs, mental health/substance abuse nurse, job skills class, disability information, VA assistance, spiritual classes, etc.  ° °HOMELESS SHELTERS ° °[facility]     °[facility]   °[address], [place]     °[phone]       °       °[facility] (women and children)       °[address] °[place], [place] [address] °336-275-0820 °[email or web address] for application and process °Application Required ° °[facility]   °[address]    °[place] [address]     °[phone]       °             °[facility] °[address] °[place], [place] [address] °[phone] °336-235-0363(schedule application appt.) °Application Required ° °[facility] (women only)    °[address]     °[place], [place] [address]     °336-884-1039      °  Intake starts 6pm daily °Need valid ID, SSC, & Police report °[facility] [place] °[address] °[place], [place] °336-881-5420 °Application Required ° °[facility] (men only)     °[address]      °[place], [place]     °[phone]      ° °[facility] °(Pregnant women only) °[address] °[place], [place] °336-275-0206 ° °[facility]      °[address]      °[place], [place] [address]     °[phone]      °       °[facility] °[address] °[place], [place] °336-723-1848 °90 day commitment/SA/Application process ° °[facility](men only)     °[address]     °[place], [place]     °[phone]       °Check-in at 7pm     °       °[facility] °[address] °[place], [place] [address] °336-248-6684 °Men/Women/Women and Children must be there by 7 pm ° °[facility] °[place],  °336-722-8721                ° °

## 2022-04-13 ENCOUNTER — Encounter (HOSPITAL_BASED_OUTPATIENT_CLINIC_OR_DEPARTMENT_OTHER): Payer: Self-pay | Admitting: Emergency Medicine

## 2022-04-13 ENCOUNTER — Emergency Department (HOSPITAL_BASED_OUTPATIENT_CLINIC_OR_DEPARTMENT_OTHER)
Admission: EM | Admit: 2022-04-13 | Discharge: 2022-04-13 | Disposition: A | Discharge status: Home / Self Care | Payer: Medicaid Other | Attending: Emergency Medicine | Admitting: Emergency Medicine

## 2022-04-13 DIAGNOSIS — D72829 Elevated white blood cell count, unspecified: Secondary | ICD-10-CM | POA: Insufficient documentation

## 2022-04-13 DIAGNOSIS — Z7984 Long term (current) use of oral hypoglycemic drugs: Secondary | ICD-10-CM | POA: Insufficient documentation

## 2022-04-13 DIAGNOSIS — E11649 Type 2 diabetes mellitus with hypoglycemia without coma: Secondary | ICD-10-CM | POA: Insufficient documentation

## 2022-04-13 DIAGNOSIS — E162 Hypoglycemia, unspecified: Secondary | ICD-10-CM

## 2022-04-13 HISTORY — DX: Type 2 diabetes mellitus without complications: E11.9

## 2022-04-13 LAB — CBC WITH DIFFERENTIAL/PLATELET
Abs Immature Granulocytes: 0.04 10*3/uL (ref 0.00–0.07)
Basophils Absolute: 0 10*3/uL (ref 0.0–0.1)
Basophils Relative: 0 %
Eosinophils Absolute: 0.1 10*3/uL (ref 0.0–0.5)
Eosinophils Relative: 0 %
HCT: 40 % (ref 36.0–46.0)
Hemoglobin: 13.6 g/dL (ref 12.0–15.0)
Immature Granulocytes: 0 %
Lymphocytes Relative: 23 %
Lymphs Abs: 2.6 10*3/uL (ref 0.7–4.0)
MCH: 30 pg (ref 26.0–34.0)
MCHC: 34 g/dL (ref 30.0–36.0)
MCV: 88.3 fL (ref 80.0–100.0)
Monocytes Absolute: 0.6 10*3/uL (ref 0.1–1.0)
Monocytes Relative: 5 %
Neutro Abs: 7.9 10*3/uL — ABNORMAL HIGH (ref 1.7–7.7)
Neutrophils Relative %: 72 %
Platelets: 356 10*3/uL (ref 150–400)
RBC: 4.53 MIL/uL (ref 3.87–5.11)
RDW: 12.8 % (ref 11.5–15.5)
WBC: 11.2 10*3/uL — ABNORMAL HIGH (ref 4.0–10.5)
nRBC: 0 % (ref 0.0–0.2)

## 2022-04-13 LAB — COMPREHENSIVE METABOLIC PANEL
ALT: 17 U/L (ref 0–44)
AST: 20 U/L (ref 15–41)
Albumin: 4 g/dL (ref 3.5–5.0)
Alkaline Phosphatase: 75 U/L (ref 38–126)
Anion gap: 9 (ref 5–15)
BUN: 10 mg/dL (ref 6–20)
CO2: 25 mmol/L (ref 22–32)
Calcium: 10 mg/dL (ref 8.9–10.3)
Chloride: 104 mmol/L (ref 98–111)
Creatinine, Ser: 0.85 mg/dL (ref 0.44–1.00)
GFR, Estimated: >60 mL/min (ref >60)
Glucose, Bld: 170 mg/dL — ABNORMAL HIGH (ref 70–99)
Potassium: 3.6 mmol/L (ref 3.5–5.1)
Sodium: 138 mmol/L (ref 135–145)
Total Bilirubin: 0.9 mg/dL (ref 0.3–1.2)
Total Protein: 8.2 g/dL — ABNORMAL HIGH (ref 6.5–8.1)

## 2022-04-13 LAB — CBG MONITORING, ED: Glucose-Capillary: 163 mg/dL — ABNORMAL HIGH (ref 70–99)

## 2022-04-13 LAB — URINALYSIS, ROUTINE W REFLEX MICROSCOPIC
Bilirubin Urine: NEGATIVE
Glucose, UA: NEGATIVE mg/dL
Hgb urine dipstick: NEGATIVE
Ketones, ur: NEGATIVE mg/dL
Leukocytes,Ua: NEGATIVE
Nitrite: NEGATIVE
Protein, ur: NEGATIVE mg/dL
Specific Gravity, Urine: 1.015 (ref 1.005–1.030)
pH: 7 (ref 5.0–8.0)

## 2022-04-13 NOTE — ED Triage Notes (Addendum)
Pt reports blood sugar was low this morning; she ate gold fish and orange juice PTA; BS 163 in triage; sts she wants to be checked out to make sure she is OK; pt is a new Type 2 diabetic

## 2022-04-13 NOTE — ED Notes (Signed)
Dc instructions reviewed with pt no questions or concerns at this time. Will follow up with pcp and will change sensor on glucometer.

## 2022-04-13 NOTE — Discharge Instructions (Addendum)
Your blood in the emergency room remained stable.  Blood work was otherwise reassuring.  Your sensor is likely inaccurate.  Follow-up with your PCP.  I recommend changing out your sensor.

## 2022-04-13 NOTE — ED Provider Notes (Signed)
[facility] EMERGENCY DEPARTMENT Provider Note   CSN: [id number] Arrival date & time: 04/13/22  1316     History  Chief Complaint  Patient presents with   Hypoglycemia    Barbara Cabrera is a 26 y.o. female.  26 year old female presents today for concern of hypoglycemia.  She is a type II diabetic and is on metformin.  She wears a freestyle libre continuous monitor.  She states she has had a reading of 50 this morning.  She had some p.o. intake with recovery of her glucose however then dropped to the 60s.  Currently 60 while she is in the emergency room.  Denies other complaints.  Denies recent illness.  Denies exogenous insulin use.  The history is provided by the patient. No language interpreter was used.      Home Medications Prior to Admission medications   Medication Sig Start Date End Date Taking? Authorizing Provider  albuterol (PROVENTIL HFA;VENTOLIN HFA) 108 (90 Base) MCG/ACT inhaler Inhale 1-2 puffs into the lungs every 6 (six) hours as needed for wheezing or shortness of breath. 05/11/18   Aviva Kluver B, PA-C  hydrOXYzine (ATARAX) 50 MG tablet Take 1 tablet (50 mg total) by mouth every 6 (six) hours as needed for anxiety. 11/02/21   Zadie Rhine, MD  metFORMIN (GLUCOPHAGE) 1000 MG tablet Take 0.5 tablets (500 mg total) by mouth 2 (two) times daily. Patient not taking: Reported on 02/20/2020 01/01/20   Renne Crigler, PA-C  cetirizine (ZYRTEC) 10 MG tablet Take 1 tablet (10 mg total) by mouth daily. Patient not taking: Reported on 01/01/2020 10/03/18 01/01/20  Belinda Fisher, PA-C  montelukast (SINGULAIR) 10 MG tablet Take 1 tablet (10 mg total) by mouth at bedtime. Patient not taking: Reported on 01/01/2020 05/11/18 01/01/20  Elisha Ponder, PA-C      Allergies    Patient has no known allergies.    Review of Systems   Review of Systems  Constitutional:  Negative for activity change, chills and fever.  Respiratory:  Negative for shortness of breath.    Cardiovascular:  Negative for chest pain.  Gastrointestinal:  Negative for abdominal pain, diarrhea, nausea and vomiting.  Genitourinary:  Negative for dysuria.  All other systems reviewed and are negative.  Physical Exam Updated Vital Signs BP (!) 143/92 (BP Location: Right Arm)   Pulse 92   Temp 97.9 F (36.6 C) (Oral)   Resp 18   Ht 5\' 9"  (1.753 m)   Wt 103.4 kg   LMP 03/12/2022   SpO2 100%   BMI 33.67 kg/m  Physical Exam Vitals and nursing note reviewed.  Constitutional:      General: She is not in acute distress.    Appearance: Normal appearance. She is not ill-appearing.  HENT:     Head: Normocephalic and atraumatic.     Nose: Nose normal.  Eyes:     General: No scleral icterus.    Extraocular Movements: Extraocular movements intact.     Conjunctiva/sclera: Conjunctivae normal.  Cardiovascular:     Rate and Rhythm: Normal rate and regular rhythm.     Pulses: Normal pulses.     Heart sounds: Normal heart sounds.  Pulmonary:     Effort: Pulmonary effort is normal. No respiratory distress.     Breath sounds: Normal breath sounds. No wheezing or rales.  Abdominal:     General: There is no distension.     Tenderness: There is no abdominal tenderness.  Musculoskeletal:  General: Normal range of motion.     Cervical back: Normal range of motion.  Skin:    General: Skin is warm and dry.  Neurological:     General: No focal deficit present.     Mental Status: She is alert. Mental status is at baseline.    ED Results / Procedures / Treatments   Labs (all labs ordered are listed, but only abnormal results are displayed) Labs Reviewed  COMPREHENSIVE METABOLIC PANEL - Abnormal; Notable for the following components:      Result Value   Glucose, Bld 170 (*)    Total Protein 8.2 (*)    All other components within normal limits  CBC WITH DIFFERENTIAL/PLATELET - Abnormal; Notable for the following components:   WBC 11.2 (*)    Neutro Abs 7.9 (*)    All other  components within normal limits  CBG MONITORING, ED - Abnormal; Notable for the following components:   Glucose-Capillary 163 (*)    All other components within normal limits  URINALYSIS, ROUTINE W REFLEX MICROSCOPIC    EKG None  Radiology No results found.  Procedures Procedures    Medications Ordered in ED Medications - No data to display  ED Course/ Medical Decision Making/ A&P                           Medical Decision Making Amount and/or Complexity of Data Reviewed Labs: ordered.   26 year old female presents today for evaluation of hyperglycemia.  She does wear a freestyle libre 3 monitor.  CBG on arrival is 160.  When I compare this to her continuous freestyle libre monitor her monitor reads 60.  She suspects that her glucose monitor is inaccurate.  I would agree with this.  Throughout the emergency room stay her glucose monitor continue to read 60.  Her BMP also showed a reading of about 170.  I doubt this is true hypoglycemia.  Discussed with her changing out her glucose sensor and follow-up with PCP.  CBC with mild leukocytosis but without left shift.  UA without UTI.   Final Clinical Impression(s) / ED Diagnoses Final diagnoses:  Hypoglycemia    Rx / DC Orders ED Discharge Orders     None         Marita Kansas, PA-C 04/13/22 1527    Maia Plan, MD 04/17/22 1311

## 2024-06-21 ENCOUNTER — Ambulatory Visit
Admission: EM | Admit: 2024-06-21 | Discharge: 2024-06-21 | Disposition: A | Discharge status: Home / Self Care | Payer: Self-pay | Attending: Family Medicine | Admitting: Family Medicine

## 2024-06-21 DIAGNOSIS — E119 Type 2 diabetes mellitus without complications: Secondary | ICD-10-CM | POA: Diagnosis not present

## 2024-06-21 DIAGNOSIS — R42 Dizziness and giddiness: Secondary | ICD-10-CM | POA: Diagnosis not present

## 2024-06-21 LAB — POCT URINE DIPSTICK
Bilirubin, UA: NEGATIVE
Blood, UA: NEGATIVE
Glucose, UA: >=1000 mg/dL — AB
Ketones, POC UA: NEGATIVE mg/dL
Nitrite, UA: NEGATIVE
POC PROTEIN,UA: NEGATIVE
Spec Grav, UA: <=1.005 — AB (ref 1.010–1.025)
Urobilinogen, UA: 1 U/dL
pH, UA: 6 (ref 5.0–8.0)

## 2024-06-21 NOTE — ED Triage Notes (Signed)
 Pt reports around 1600 today at work she was yawning, started having a headache, she felt the room was spinning, she felt like passing out, her speech slurred a little bit, twitching right eye. States she is diabetic and she have not taking her metformin  in 1 year. Pt ate and drank after the episode and still feel woozy.

## 2024-06-21 NOTE — ED Provider Notes (Signed)
 [facility]RGENT [facility]ARE [facility]  Note:  This document was prepared using Conservation officer, historic buildings and may include unintentional dictation errors.  MRN: [id number] DOB: 1996/11/06  Subjective:   Barbara Cabrera is a 28 y.o. female presenting for acute onset of lightheadedness, headache, vertigo, slurred speech, right eye twitching, feeling woozy while at work today. Symptoms started ~1 hour prior to arrival in clinic. She ate ~30 minutes before coming into our clinic as well. Has a history of diabetes but has not taken her metformin  for ~8 months. Reports that she does not drink water very well.  Works at [employer].  Has not had follow-up for her diabetes in months.  No fever, confusion, weakness, numbness or tingling, chest pain, heart racing, palpitations, nausea, vomiting, abdominal pain, urinary symptoms, changes to bowel or urinary habits, rashes.  No drug use.  No alcohol use.  No current facility-administered medications for this encounter.  Current Outpatient Medications:    albuterol  (PROVENTIL  HFA;VENTOLIN  HFA) 108 (90 Base) MCG/ACT inhaler, Inhale 1-2 puffs into the lungs every 6 (six) hours as needed for wheezing or shortness of breath., Disp: 1 Inhaler, Rfl: 1   hydrOXYzine  (ATARAX ) 50 MG tablet, Take 1 tablet (50 mg total) by mouth every 6 (six) hours as needed for anxiety., Disp: 15 tablet, Rfl: 0   metFORMIN  (GLUCOPHAGE ) 1000 MG tablet, Take 0.5 tablets (500 mg total) by mouth 2 (two) times daily. (Patient not taking: Reported on 02/20/2020), Disp: 60 tablet, Rfl: 0   No Known Allergies  Past Medical History:  Diagnosis Date   Anxiety    Asthma    Diabetes mellitus without complication (HCC)      History reviewed. No pertinent surgical history.  Family History  Problem Relation Age of Onset   Diabetes Other    Hypertension Other     Social History   Tobacco Use   Smoking status: Never   Smokeless tobacco: Never  Vaping Use   Vaping status: Never Used   Substance Use Topics   Alcohol use: No   Drug use: No    ROS   Objective:   Vitals: BP 126/87 (BP Location: Left Arm)   Pulse 75   Temp 98.1 F (36.7 C) (Oral)   Resp 18   LMP  (Within Months) Comment: 1 month  SpO2 97%   Physical Exam Constitutional:      General: She is not in acute distress.    Appearance: Normal appearance. She is well-developed. She is not ill-appearing, toxic-appearing or diaphoretic.  HENT:     Head: Normocephalic and atraumatic.     Right Ear: External ear normal.     Left Ear: External ear normal.     Nose: Nose normal.     Mouth/Throat:     Mouth: Mucous membranes are moist.  Eyes:     General: No scleral icterus.       Right eye: No discharge.        Left eye: No discharge.     Extraocular Movements: Extraocular movements intact.     Conjunctiva/sclera: Conjunctivae normal.     Pupils: Pupils are equal, round, and reactive to light.  Cardiovascular:     Rate and Rhythm: Normal rate and regular rhythm.     Heart sounds: Normal heart sounds. No murmur heard.    No friction rub. No gallop.  Pulmonary:     Effort: Pulmonary effort is normal. No respiratory distress.     Breath sounds: No stridor. No wheezing,  rhonchi or rales.  Chest:     Chest wall: No tenderness.  Skin:    General: Skin is warm and dry.  Neurological:     General: No focal deficit present.     Mental Status: She is alert and oriented to person, place, and time.     Cranial Nerves: No cranial nerve deficit.     Motor: No weakness.     Coordination: Coordination normal.     Gait: Gait normal.     Deep Tendon Reflexes: Reflexes normal.     Comments: No facial asymmetry.   Psychiatric:        Mood and Affect: Mood normal.        Behavior: Behavior normal.        Thought Content: Thought content normal.        Judgment: Judgment normal.     Results for orders placed or performed during the hospital encounter of 06/21/24 (from the past 24 hours)  POCT URINE  DIPSTICK     Status: Abnormal   Collection Time: 06/21/24  5:12 PM  Result Value Ref Range   Color, UA yellow yellow   Clarity, UA hazy (A) clear   Glucose, UA >=1,000 (A) negative mg/dL   Bilirubin, UA negative negative   Ketones, POC UA negative negative mg/dL   Spec Grav, UA <=8.994 (A) 1.010 - 1.025   Blood, UA negative negative   pH, UA 6.0 5.0 - 8.0   POC PROTEIN,UA negative negative, trace   Urobilinogen, UA 1.0 0.2 or 1.0 E.U./dL   Nitrite, UA Negative Negative   Leukocytes, UA Trace (A) Negative    Assessment and Plan :   PDMP not reviewed this encounter.  1. Lightheaded   2. Wooziness   3. Type 2 diabetes mellitus treated without insulin (HCC)    Reassuring neurologic exam, cardiopulmonary exam.  Recommended conservative management, general health maintenance, diabetic friendly diet.  Will follow-up with blood test results tomorrow.  Counseled patient on potential for adverse effects with medications prescribed/recommended today, ER and return-to-clinic precautions discussed, patient verbalized understanding.    Christopher Savannah, [place] 06/21/24 1731

## 2024-06-22 ENCOUNTER — Ambulatory Visit
Admission: EM | Admit: 2024-06-22 | Discharge: 2024-06-22 | Disposition: A | Discharge status: Home / Self Care | Attending: Family Medicine | Admitting: Family Medicine

## 2024-06-22 ENCOUNTER — Ambulatory Visit: Payer: Self-pay | Admitting: Urgent Care

## 2024-06-22 DIAGNOSIS — E119 Type 2 diabetes mellitus without complications: Secondary | ICD-10-CM | POA: Diagnosis not present

## 2024-06-22 LAB — CBC
Hematocrit: 40.6 % (ref 34.0–46.6)
Hemoglobin: 13.2 g/dL (ref 11.1–15.9)
MCH: 29.7 pg (ref 26.6–33.0)
MCHC: 32.5 g/dL (ref 31.5–35.7)
MCV: 91 fL (ref 79–97)
Platelets: 305 x10E3/uL (ref 150–450)
RBC: 4.44 x10E6/uL (ref 3.77–5.28)
RDW: 12.4 % (ref 11.7–15.4)
WBC: 10.1 x10E3/uL (ref 3.4–10.8)

## 2024-06-22 LAB — COMPREHENSIVE METABOLIC PANEL WITH GFR
ALT: 15 IU/L (ref 0–32)
AST: 13 IU/L (ref 0–40)
Albumin: 4.4 g/dL (ref 4.0–5.0)
Alkaline Phosphatase: 136 IU/L — ABNORMAL HIGH (ref 44–121)
BUN/Creatinine Ratio: 10 (ref 9–23)
BUN: 9 mg/dL (ref 6–20)
Bilirubin Total: 0.5 mg/dL (ref 0.0–1.2)
CO2: 18 mmol/L — ABNORMAL LOW (ref 20–29)
Calcium: 9.7 mg/dL (ref 8.7–10.2)
Chloride: 97 mmol/L (ref 96–106)
Creatinine, Ser: 0.86 mg/dL (ref 0.57–1.00)
Globulin, Total: 3 g/dL (ref 1.5–4.5)
Glucose: 307 mg/dL — ABNORMAL HIGH (ref 70–99)
Potassium: 4.4 mmol/L (ref 3.5–5.2)
Sodium: 134 mmol/L (ref 134–144)
Total Protein: 7.4 g/dL (ref 6.0–8.5)
eGFR: 95 mL/min/1.73 (ref >59)

## 2024-06-22 LAB — HEMOGLOBIN A1C
Est. average glucose Bld gHb Est-mCnc: 306 mg/dL
Hgb A1c MFr Bld: 12.3 % — ABNORMAL HIGH (ref 4.8–5.6)

## 2024-06-22 MED ORDER — METFORMIN HCL 1000 MG PO TABS
1000.0000 mg | ORAL_TABLET | Freq: Two times a day (BID) | ORAL | 1 refills | Status: AC
Start: 2024-06-22 — End: ?

## 2024-06-22 MED ORDER — GLIPIZIDE 2.5 MG PO TABS: 1.0000 | ORAL_TABLET | Freq: Two times a day (BID) | ORAL | 1 refills | Status: DC

## 2024-06-22 NOTE — ED Triage Notes (Signed)
 Pt presents to go over lab results review and treatment after the visit at this UC yesterday.

## 2024-06-22 NOTE — ED Provider Notes (Signed)
 [facility]  Note:  This document was prepared using Conservation officer, historic buildings and may include unintentional dictation errors.  MRN: [id number] DOB: [birth year]  Subjective:   Barbara Cabrera is a 28 y.o. female presenting for recheck on diabetes.  Patient was seen yesterday and had extensive labs done.  Symptoms from yesterday have improved and has not had recurrence.  She does have a PCP but has not followed up with them in months.  Was previously taken metformin  extended release but stopped more than 6 months ago.  Does not practice a diabetic friendly diet.  Denies confusion, headache, dizziness, vision change, abdominal pain, shortness of breath, nausea, vomiting, hematuria.  No history of kidney disease.  No current facility-administered medications for this encounter.  Current Outpatient Medications:    albuterol  (PROVENTIL  HFA;VENTOLIN  HFA) 108 (90 Base) MCG/ACT inhaler, Inhale 1-2 puffs into the lungs every 6 (six) hours as needed for wheezing or shortness of breath., Disp: 1 Inhaler, Rfl: 1   hydrOXYzine  (ATARAX ) 50 MG tablet, Take 1 tablet (50 mg total) by mouth every 6 (six) hours as needed for anxiety., Disp: 15 tablet, Rfl: 0   metFORMIN  (GLUCOPHAGE ) 1000 MG tablet, Take 0.5 tablets (500 mg total) by mouth 2 (two) times daily. (Patient not taking: Reported on 02/20/2020), Disp: 60 tablet, Rfl: 0   No Known Allergies  Past Medical History:  Diagnosis Date   Anxiety    Asthma    Diabetes mellitus without complication (HCC)      History reviewed. No pertinent surgical history.  Family History  Problem Relation Age of Onset   Diabetes Other    Hypertension Other     Social History   Tobacco Use   Smoking status: Never   Smokeless tobacco: Never  Vaping Use   Vaping status: Never Used  Substance Use Topics   Alcohol use: No   Drug use: No    ROS   Objective:   Vitals: BP 135/83 (BP Location: Right Arm)   Pulse 91   Temp 98.7  F (37.1 C) (Oral)   Resp 18   LMP  (Within Months)   SpO2 97%   Physical Exam Constitutional:      General: She is not in acute distress.    Appearance: Normal appearance. She is well-developed. She is not ill-appearing, toxic-appearing or diaphoretic.  HENT:     Head: Normocephalic and atraumatic.     Nose: Nose normal.     Mouth/Throat:     Mouth: Mucous membranes are moist.  Eyes:     General: No scleral icterus.       Right eye: No discharge.        Left eye: No discharge.     Extraocular Movements: Extraocular movements intact.  Cardiovascular:     Rate and Rhythm: Normal rate.  Pulmonary:     Effort: Pulmonary effort is normal.  Skin:    General: Skin is warm and dry.  Neurological:     General: No focal deficit present.     Mental Status: She is alert and oriented to person, place, and time.  Psychiatric:        Mood and Affect: Mood normal.        Behavior: Behavior normal.     Results for orders placed or performed during the hospital encounter of 06/21/24 (from the past 24 hours)  POCT URINE DIPSTICK     Status: Abnormal   Collection Time: 06/21/24  5:12 PM  Result  Value Ref Range   Color, UA yellow yellow   Clarity, UA hazy (A) clear   Glucose, UA >=1,000 (A) negative mg/dL   Bilirubin, UA negative negative   Ketones, POC UA negative negative mg/dL   Spec Grav, UA <=8.994 (A) 1.010 - 1.025   Blood, UA negative negative   pH, UA 6.0 5.0 - 8.0   POC PROTEIN,UA negative negative, trace   Urobilinogen, UA 1.0 0.2 or 1.0 E.U./dL   Nitrite, UA Negative Negative   Leukocytes, UA Trace (A) Negative  CBC     Status: None   Collection Time: 06/21/24  5:30 PM  Result Value Ref Range   WBC 10.1 3.4 - 10.8 x10E3/uL   RBC 4.44 3.77 - 5.28 x10E6/uL   Hemoglobin 13.2 11.1 - 15.9 g/dL   Hematocrit 59.3 65.9 - 46.6 %   MCV 91 79 - 97 fL   MCH 29.7 26.6 - 33.0 pg   MCHC 32.5 31.5 - 35.7 g/dL   RDW 87.5 88.2 - 84.5 %   Platelets 305 150 - 450 x10E3/uL    Narrative   Performed at:  [address], [place], [place]  [id number] Lab Director: Frankey Sas MD, Phone:  [phone]  Comprehensive metabolic panel     Status: Abnormal   Collection Time: 06/21/24  5:30 PM  Result Value Ref Range   Glucose 307 (H) 70 - 99 mg/dL   BUN 9 6 - 20 mg/dL   Creatinine, Ser 9.13 0.57 - 1.00 mg/dL   eGFR 95 >40 fO/fpw/8.26   BUN/Creatinine Ratio 10 9 - 23   Sodium 134 134 - 144 mmol/L   Potassium 4.4 3.5 - 5.2 mmol/L   Chloride 97 96 - 106 mmol/L   CO2 18 (L) 20 - 29 mmol/L   Calcium 9.7 8.7 - 10.2 mg/dL   Total Protein 7.4 6.0 - 8.5 g/dL   Albumin 4.4 4.0 - 5.0 g/dL   Globulin, Total 3.0 1.5 - 4.5 g/dL   Bilirubin Total 0.5 0.0 - 1.2 mg/dL   Alkaline Phosphatase 136 (H) 44 - 121 IU/L   AST 13 0 - 40 IU/L   ALT 15 0 - 32 IU/L   Narrative   Performed at:  [address] [facility] [address], [place], [place]  [id number] Lab Director: Frankey Sas MD, Phone:  [phone]  Hemoglobin A1c     Status: Abnormal   Collection Time: 06/21/24  5:30 PM  Result Value Ref Range   Hgb A1c MFr Bld 12.3 (H) 4.8 - 5.6 %   Est. average glucose Bld gHb Est-mCnc 306 mg/dL   Narrative   Performed at:  [address] [place], [place]  [id number] Lab Director: Frankey Sas MD, Phone:  [phone]    Assessment and Plan :   PDMP not reviewed this encounter.  1. Type 2 diabetes mellitus treated without insulin (HCC)    Extensive review and counseling provided for type 2 diabetes that is severely uncontrolled.  Patient does not want to start insulin.  Emphasized need for significant dietary modifications, commitment to diabetic friendly diet.  She was agreeable to start metformin  and I will also be starting her on glipizide .  Referral placed to diabetic nutritional counseling.  Follow-up with her PCP asap. Counseled patient on potential for adverse effects with medications prescribed/recommended today, ER and  return-to-clinic precautions discussed, patient verbalized understanding.    Christopher Savannah, [place] 06/22/24 8696

## 2024-06-22 NOTE — Discharge Instructions (Addendum)
 Metformin  Dosing (to be taken with food) Week 1: take 1/2 tablet twice a day. Week 2: take 1 tablet in the morning, 1/2 tablet at night. Week 3: take 1 tablets twice a day. This is your daily maintenance dose to stay on indefinitely.   For diabetes or elevated blood sugar, please make sure you are limiting and avoiding starchy, carbohydrate foods like pasta, breads, sweet breads, pastry, rice, potatoes, desserts. These foods can elevate your blood sugar. Also, limit and avoid drinks that contain a lot of sugar such as sodas, sweet teas, fruit juices.  Drinking plain water will be much more helpful, try 80 ounces of water daily.  It is okay to flavor your water naturally by cutting cucumber, lemon, mint or lime, placing it in a picture with water and drinking it over a period of 24-48 hours as long as it remains refrigerated.  For elevated blood pressure, make sure you are monitoring salt in your diet.  Do not eat restaurant foods and limit processed foods at home. I highly recommend you prepare and cook your own foods at home.  Processed foods include things like frozen meals, pre-seasoned meats and dinners, deli meats, canned foods as these foods contain a high amount of sodium/salt.  Make sure you are paying attention to sodium labels on foods you buy at the grocery store. Buy your spices separately such as garlic powder, onion powder, cumin, cayenne, parsley flakes so that you can avoid seasonings that contain salt. However, salt-free seasonings are available and can be used, an example is Mrs. Dash and includes a lot of different mixtures that do not contain salt.  Lastly, when cooking using oils that are healthier for you is important. This includes olive oil, avocado oil, canola oil. We have discussed a lot of foods to avoid but below is a list of foods that can be very healthy to use in your diet whether it is for diabetes, cholesterol, high blood pressure, or in general healthy eating.  Salads -  kale, spinach, cabbage, spring mix, arugula Fruits - avocadoes, berries (blueberries, raspberries, blackberries), apples, oranges, pomegranate, grapefruit, kiwi Vegetables - asparagus, cauliflower, broccoli, green beans, brussel sprouts, bell peppers, beets; stay away from or limit starchy vegetables like potatoes, carrots, peas Other general foods - kidney beans, egg whites, almonds, walnuts, sunflower seeds, pumpkin seeds, fat free yogurt, almond milk, flax seeds, quinoa, oats  Meat - It is better to eat lean meats and limit your red meat including pork to once a week.  Wild caught fish, chicken breast are good options as they tend to be leaner sources of good protein. Still be mindful of the sodium labels for the meats you buy.  DO NOT EAT ANY FOODS ON THIS LIST THAT YOU ARE ALLERGIC TO. For more specific needs, I highly recommend consulting a dietician or nutritionist but this can definitely be a good starting point.

## 2024-07-09 ENCOUNTER — Encounter (HOSPITAL_BASED_OUTPATIENT_CLINIC_OR_DEPARTMENT_OTHER): Payer: Self-pay | Admitting: Emergency Medicine

## 2024-07-09 ENCOUNTER — Other Ambulatory Visit: Payer: Self-pay

## 2024-07-09 ENCOUNTER — Emergency Department (HOSPITAL_BASED_OUTPATIENT_CLINIC_OR_DEPARTMENT_OTHER)
Admission: EM | Admit: 2024-07-09 | Discharge: 2024-07-10 | Disposition: A | Discharge status: Other Acute Inpatient Hospital | Source: Home / Self Care | Attending: Emergency Medicine | Admitting: Emergency Medicine

## 2024-07-09 DIAGNOSIS — Z79899 Other long term (current) drug therapy: Secondary | ICD-10-CM | POA: Insufficient documentation

## 2024-07-09 DIAGNOSIS — R45851 Suicidal ideations: Secondary | ICD-10-CM | POA: Insufficient documentation

## 2024-07-09 DIAGNOSIS — F332 Major depressive disorder, recurrent severe without psychotic features: Secondary | ICD-10-CM | POA: Insufficient documentation

## 2024-07-09 DIAGNOSIS — F419 Anxiety disorder, unspecified: Secondary | ICD-10-CM | POA: Insufficient documentation

## 2024-07-09 LAB — CBC
HCT: 40.4 % (ref 36.0–46.0)
Hemoglobin: 13.7 g/dL (ref 12.0–15.0)
MCH: 29.8 pg (ref 26.0–34.0)
MCHC: 33.9 g/dL (ref 30.0–36.0)
MCV: 88 fL (ref 80.0–100.0)
Platelets: 367 K/uL (ref 150–400)
RBC: 4.59 MIL/uL (ref 3.87–5.11)
RDW: 12.4 % (ref 11.5–15.5)
WBC: 12.7 K/uL — ABNORMAL HIGH (ref 4.0–10.5)
nRBC: 0 % (ref 0.0–0.2)

## 2024-07-09 LAB — COMPREHENSIVE METABOLIC PANEL WITH GFR
ALT: 9 U/L (ref 0–44)
AST: 15 U/L (ref 15–41)
Albumin: 4.5 g/dL (ref 3.5–5.0)
Alkaline Phosphatase: 91 U/L (ref 38–126)
Anion gap: 14 (ref 5–15)
BUN: 7 mg/dL (ref 6–20)
CO2: 24 mmol/L (ref 22–32)
Calcium: 9.7 mg/dL (ref 8.9–10.3)
Chloride: 103 mmol/L (ref 98–111)
Creatinine, Ser: 0.72 mg/dL (ref 0.44–1.00)
GFR, Estimated: >60 mL/min (ref >60)
Glucose, Bld: 211 mg/dL — ABNORMAL HIGH (ref 70–99)
Potassium: 3.8 mmol/L (ref 3.5–5.1)
Sodium: 140 mmol/L (ref 135–145)
Total Bilirubin: 0.9 mg/dL (ref 0.0–1.2)
Total Protein: 7.8 g/dL (ref 6.5–8.1)

## 2024-07-09 LAB — PREGNANCY, URINE: Preg Test, Ur: NEGATIVE

## 2024-07-09 LAB — ETHANOL: Alcohol, Ethyl (B): <15 mg/dL (ref <15)

## 2024-07-09 NOTE — ED Triage Notes (Signed)
 Pt reports anxiety that started today, denies hx of the same, pt is tearful but calm in triage and has been crying all day, reports HA

## 2024-07-10 ENCOUNTER — Inpatient Hospital Stay (HOSPITAL_COMMUNITY)
Admission: AD | Admit: 2024-07-10 | Discharge: 2024-07-13 | DRG: 880 | Disposition: A | Discharge status: Home / Self Care | Source: Intra-hospital

## 2024-07-10 ENCOUNTER — Encounter (HOSPITAL_COMMUNITY): Payer: Self-pay | Admitting: Family Medicine

## 2024-07-10 DIAGNOSIS — J45909 Unspecified asthma, uncomplicated: Secondary | ICD-10-CM | POA: Diagnosis present

## 2024-07-10 DIAGNOSIS — R350 Frequency of micturition: Secondary | ICD-10-CM | POA: Diagnosis present

## 2024-07-10 DIAGNOSIS — F339 Major depressive disorder, recurrent, unspecified: Secondary | ICD-10-CM | POA: Diagnosis present

## 2024-07-10 DIAGNOSIS — E1165 Type 2 diabetes mellitus with hyperglycemia: Secondary | ICD-10-CM | POA: Diagnosis present

## 2024-07-10 DIAGNOSIS — R45851 Suicidal ideations: Secondary | ICD-10-CM | POA: Diagnosis present

## 2024-07-10 DIAGNOSIS — R8271 Bacteriuria: Secondary | ICD-10-CM | POA: Diagnosis not present

## 2024-07-10 DIAGNOSIS — Z7984 Long term (current) use of oral hypoglycemic drugs: Secondary | ICD-10-CM

## 2024-07-10 DIAGNOSIS — F411 Generalized anxiety disorder: Principal | ICD-10-CM | POA: Diagnosis present

## 2024-07-10 DIAGNOSIS — F410 Panic disorder [episodic paroxysmal anxiety] without agoraphobia: Secondary | ICD-10-CM | POA: Diagnosis present

## 2024-07-10 DIAGNOSIS — F4310 Post-traumatic stress disorder, unspecified: Secondary | ICD-10-CM | POA: Diagnosis present

## 2024-07-10 DIAGNOSIS — Z794 Long term (current) use of insulin: Secondary | ICD-10-CM

## 2024-07-10 DIAGNOSIS — E119 Type 2 diabetes mellitus without complications: Secondary | ICD-10-CM | POA: Diagnosis not present

## 2024-07-10 DIAGNOSIS — Z9151 Personal history of suicidal behavior: Secondary | ICD-10-CM

## 2024-07-10 LAB — URINALYSIS, ROUTINE W REFLEX MICROSCOPIC
Bilirubin Urine: NEGATIVE
Glucose, UA: NEGATIVE mg/dL
Hgb urine dipstick: NEGATIVE
Ketones, ur: NEGATIVE mg/dL
Nitrite: NEGATIVE
Protein, ur: NEGATIVE mg/dL
Specific Gravity, Urine: 1.025 (ref 1.005–1.030)
pH: 5.5 (ref 5.0–8.0)

## 2024-07-10 LAB — URINE DRUG SCREEN
Amphetamines: NOT DETECTED
Barbiturates: NOT DETECTED
Benzodiazepines: NOT DETECTED
Cocaine: NOT DETECTED
Fentanyl: NOT DETECTED
Methadone Scn, Ur: NOT DETECTED
Opiates: NOT DETECTED
Tetrahydrocannabinol: NOT DETECTED

## 2024-07-10 LAB — CBG MONITORING, ED
Glucose-Capillary: 255 mg/dL — ABNORMAL HIGH (ref 70–99)
Glucose-Capillary: 178 mg/dL — ABNORMAL HIGH (ref 70–99)

## 2024-07-10 LAB — GLUCOSE, CAPILLARY
Glucose-Capillary: 190 mg/dL — ABNORMAL HIGH (ref 70–99)
Glucose-Capillary: 194 mg/dL — ABNORMAL HIGH (ref 70–99)

## 2024-07-10 LAB — URINALYSIS, MICROSCOPIC (REFLEX): RBC / HPF: NONE SEEN RBC/hpf (ref 0–5)

## 2024-07-10 MED ORDER — LORAZEPAM 2 MG/ML IJ SOLN: 2.0000 mg | Freq: Three times a day (TID) | INTRAMUSCULAR | Status: DC | PRN

## 2024-07-10 MED ORDER — ALBUTEROL SULFATE HFA 108 (90 BASE) MCG/ACT IN AERS: 1.0000 | INHALATION_SPRAY | Freq: Four times a day (QID) | RESPIRATORY_TRACT | Status: DC | PRN

## 2024-07-10 MED ORDER — TRAZODONE HCL 50 MG PO TABS: 50.0000 mg | ORAL_TABLET | Freq: Every evening | ORAL | Status: DC | PRN

## 2024-07-10 MED ORDER — ACETAMINOPHEN 500 MG PO TABS
1000.0000 mg | ORAL_TABLET | Freq: Four times a day (QID) | ORAL | Status: DC | PRN
  Administered 2024-07-10: 1000 mg via ORAL
  Filled 2024-07-10: qty 2

## 2024-07-10 MED ORDER — HALOPERIDOL 5 MG PO TABS: 5.0000 mg | ORAL_TABLET | Freq: Three times a day (TID) | ORAL | Status: DC | PRN

## 2024-07-10 MED ORDER — INSULIN ASPART 100 UNIT/ML IJ SOLN
0.0000 [IU] | Freq: Three times a day (TID) | INTRAMUSCULAR | Status: DC
  Administered 2024-07-10: 8 [IU] via SUBCUTANEOUS

## 2024-07-10 MED ORDER — DIPHENHYDRAMINE HCL 50 MG/ML IJ SOLN: 50.0000 mg | Freq: Three times a day (TID) | INTRAMUSCULAR | Status: DC | PRN

## 2024-07-10 MED ORDER — ALUM & MAG HYDROXIDE-SIMETH 200-200-20 MG/5ML PO SUSP: 30.0000 mL | ORAL | Status: DC | PRN

## 2024-07-10 MED ORDER — INSULIN ASPART 100 UNIT/ML IJ SOLN
0.0000 [IU] | Freq: Three times a day (TID) | INTRAMUSCULAR | Status: DC
  Administered 2024-07-10 – 2024-07-11 (×3): 3 [IU] via SUBCUTANEOUS
  Administered 2024-07-11 – 2024-07-12 (×3): 2 [IU] via SUBCUTANEOUS
  Administered 2024-07-12: 3 [IU] via SUBCUTANEOUS
  Administered 2024-07-13: 2 [IU] via SUBCUTANEOUS

## 2024-07-10 MED ORDER — DIPHENHYDRAMINE HCL 25 MG PO CAPS: 50.0000 mg | ORAL_CAPSULE | Freq: Three times a day (TID) | ORAL | Status: DC | PRN

## 2024-07-10 MED ORDER — METFORMIN HCL 500 MG PO TABS
1000.0000 mg | ORAL_TABLET | Freq: Two times a day (BID) | ORAL | Status: DC
  Administered 2024-07-10 – 2024-07-13 (×6): 1000 mg via ORAL
  Filled 2024-07-10 (×7): qty 2

## 2024-07-10 MED ORDER — HYDROXYZINE HCL 25 MG PO TABS: 25.0000 mg | ORAL_TABLET | Freq: Three times a day (TID) | ORAL | Status: DC | PRN

## 2024-07-10 MED ORDER — HALOPERIDOL LACTATE 5 MG/ML IJ SOLN: 5.0000 mg | Freq: Three times a day (TID) | INTRAMUSCULAR | Status: DC | PRN

## 2024-07-10 MED ORDER — ACETAMINOPHEN 325 MG PO TABS: 650.0000 mg | ORAL_TABLET | Freq: Four times a day (QID) | ORAL | Status: DC | PRN

## 2024-07-10 MED ORDER — GLIPIZIDE 2.5 MG PO TABS: 1.0000 | ORAL_TABLET | Freq: Two times a day (BID) | ORAL | Status: DC

## 2024-07-10 MED ORDER — METFORMIN HCL 500 MG PO TABS
1000.0000 mg | ORAL_TABLET | Freq: Two times a day (BID) | ORAL | Status: DC
  Administered 2024-07-10 (×2): 1000 mg via ORAL
  Filled 2024-07-10 (×2): qty 2

## 2024-07-10 MED ORDER — MAGNESIUM HYDROXIDE 400 MG/5ML PO SUSP: 30.0000 mL | Freq: Every day | ORAL | Status: DC | PRN

## 2024-07-10 MED ORDER — HALOPERIDOL LACTATE 5 MG/ML IJ SOLN: 10.0000 mg | Freq: Three times a day (TID) | INTRAMUSCULAR | Status: DC | PRN

## 2024-07-10 NOTE — ED Notes (Signed)
 Pt requesting that she not be admitted pt has been co operative but is tearful now and states she is dosent want this

## 2024-07-10 NOTE — ED Notes (Signed)
 Patient given cell phone to get numbers off of per RN request. Patient given nurse phone to make 2 phone calls.

## 2024-07-10 NOTE — ED Notes (Signed)
 Pt was granted access to a hospital issued phone at this time. Pt notes she is going to try and call her mom

## 2024-07-10 NOTE — Progress Notes (Signed)
 Pt has been accepted to [facility] on 07/10/2024 Bed assignment: 403-01  Pt meets inpatient criteria per: [name] NP  Attending Physician will be: Dr. Prentis    Report can be called to: Adult unit: [phone]  Pt can arrive after [facility] WILL UPDATE   Care Team Notified: [facility]  Cherylynn Ernst RN, Suzen Lesches NP  [place] Swoboda LCSW-A   07/10/2024 11:05 AM

## 2024-07-10 NOTE — ED Notes (Signed)
Patient provided snack.

## 2024-07-10 NOTE — ED Notes (Signed)
 Attempting to call report to [facility] x2

## 2024-07-10 NOTE — ED Notes (Signed)
 Person that pt lives with is here to visit Barbara Cabrera  [phone]
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
## 2024-07-10 NOTE — Tx Team (Signed)
 Initial Treatment Plan 07/10/2024 5:56 PM Barbara Cabrera FMW:[id number]    PATIENT STRESSORS:  housing,  anxiety    PATIENT STRENGTHS: Ability for insight  Communication skills  Special hobby/interest    PATIENT IDENTIFIED PROBLEMS: Housing  anxiety  Depression                  DISCHARGE CRITERIA:  Ability to meet basic life and health needs Adequate post-discharge living arrangements Improved stabilization in mood, thinking, and/or behavior Need for constant or close observation no longer present Reduction of life-threatening or endangering symptoms to within safe limits  PRELIMINARY DISCHARGE PLAN: Placement in alternative living arrangements Referrals indicated:  housing and living arrangements.  PATIENT/FAMILY INVOLVEMENT: This treatment plan has been presented to and reviewed with the patient, Barbara Cabrera,  The patient and family have been given the opportunity to ask questions and make suggestions.  Deisha Stull, Camellia Olszewski, RN 07/10/2024, 5:56 PM

## 2024-07-10 NOTE — ED Notes (Signed)
 Per TTS pt is reassured and will be admitted

## 2024-07-10 NOTE — ED Notes (Signed)
Patient on phone with TTS. 

## 2024-07-10 NOTE — Progress Notes (Signed)
 Pt awake and alert at time of admission.  Tearful with sad depressed affect. Pt reports feeling  extremely anxious.  Pt was searched with no contraband found.  Pt signed consents and paperwork without incident. She was escorted onto unit and orientated to milieu and room.   Q 15 min checks started for safety.

## 2024-07-10 NOTE — ED Notes (Signed)
 Now awaiting dayshift AC, inpatient care was recommended.

## 2024-07-10 NOTE — Group Note (Signed)
 Date:  07/10/2024 Time:  4:12 PM  Group Topic/Focus: Sleep/ Wellness Education Wellness Toolbox:   The focus of this group is to discuss various aspects of wellness, balancing those aspects and exploring ways to increase the ability to experience wellness.  Patients will create a wellness toolbox for use upon discharge.    Participation Level:  Active  Participation Quality:  Appropriate  Affect:  Appropriate  Cognitive:  Appropriate  Insight: Appropriate  Engagement in Group:  Engaged  Modes of Intervention:  Discussion  Additional Comments:  Patient was engaged appropriately during the group.  Zakyria Metzinger D Dannika Hilgeman 07/10/2024, 4:12 PM

## 2024-07-10 NOTE — ED Provider Notes (Signed)
 Received patient in turnover from Dr. Haze.  Please see their note for further details of Hx, PE.  Briefly patient is a 28 y.o. female with a Anxiety and Suicidal .  Patient has been seen by CTS and they recommend inpatient.  She now has a room.  Is clinically stable for transfer.    Emil Share, DO 07/10/24 1139

## 2024-07-10 NOTE — BH Assessment (Addendum)
 At 05:36 clinician called and spoke to nursing staff Sallyann) about needing the EDP note put in before patient could have her teleassessment.  At 05:39 the note was in and clinician asked for the cart to be put in the room.  CCA was initiated at 05:44.

## 2024-07-10 NOTE — BH Assessment (Addendum)
 There's no EDP note in the pt's chart as of 0132, 0200, 0248, 0405 and 0516.  Pt to be assessed once EDP note is complete.   Jackson JONETTA Broach, MS, [facility], [facility] Triage Specialist [phone]

## 2024-07-10 NOTE — ED Notes (Signed)
 Pt has given phone back to staff, no contact was able to be made

## 2024-07-10 NOTE — ED Notes (Signed)
 Repeat TTS being done now

## 2024-07-10 NOTE — ED Notes (Signed)
 Pt belongings were gathered by ED tech in triage during dress out.  They are located in the [facility] locker #1.  Key placed back in care of charge desk.

## 2024-07-10 NOTE — ED Notes (Signed)
 Pt is requesting to access phone numbers from her personal cell phone same is located in the [facility] #1 locker.

## 2024-07-10 NOTE — ED Notes (Signed)
 Report given to house coverage [facility]

## 2024-07-10 NOTE — ED Notes (Signed)
 EKG and urine done

## 2024-07-10 NOTE — BH Assessment (Addendum)
 Pt to be assessed once EDP note is entered in chart.    Jackson JONETTA Broach, MS, [facility], [facility] Triage Specialist [phone]

## 2024-07-10 NOTE — ED Notes (Signed)
 Patient provided cereal and given remote control to watch TV.

## 2024-07-10 NOTE — ED Notes (Signed)
 Pt is requesting her phone at this time.  She was informed that she would not be given same at this time.  Will continue to monitor.

## 2024-07-10 NOTE — ED Notes (Signed)
 Patient finished speaking to TTS In better spirits doing puzzles and coloring in the room.

## 2024-07-10 NOTE — ED Provider Notes (Signed)
 [place] EMERGENCY DEPARTMENT AT [facility] Provider Note   CSN: [id number] Arrival date & time: 07/09/24  2309     Patient presents with: Anxiety and Suicidal   Barbara Cabrera is a 28 y.o. female.   Patient presents to the emergency department with anxiety and depression.  Patient reports that she is under a great deal of stress from multiple sources.  This is causing her anxiousness but also that she is depressed and is feeling suicidal.  She has attempted suicide by overdose in the past.  Patient has considered overdosing again.       Prior to Admission medications   Medication Sig Start Date End Date Taking? Authorizing Provider  albuterol  (PROVENTIL  HFA;VENTOLIN  HFA) 108 (90 Base) MCG/ACT inhaler Inhale 1-2 puffs into the lungs every 6 (six) hours as needed for wheezing or shortness of breath. 05/11/18   Murray, Alyssa B, PA-C  glipiZIDE  2.5 MG TABS Take 1 tablet by mouth 2 (two) times daily. 06/22/24   Bayler Gehrig Sav[place], PA-C  hydrOXYzine  (ATARAX ) 50 MG tablet Take 1 tablet (50 mg total) by mouth every 6 (six) hours as needed for anxiety. 11/02/21   Midge Golas, MD  metFORMIN  (GLUCOPHAGE ) 1000 MG tablet Take 1 tablet (1,000 mg total) by mouth 2 (two) times daily. 06/22/24   Maddilyn Campus Sav[place], PA-C  cetirizine  (ZYRTEC ) 10 MG tablet Take 1 tablet (10 mg total) by mouth daily. Patient not taking: Reported on 01/01/2020 10/03/18 01/01/20  Babara Greig GAILS, PA-C  montelukast  (SINGULAIR ) 10 MG tablet Take 1 tablet (10 mg total) by mouth at bedtime. Patient not taking: Reported on 01/01/2020 05/11/18 01/01/20  Jason Mardy NOVAK, PA-C    Allergies: Patient has no known allergies.    Review of Systems  Updated Vital Signs BP (!) 131/94 (BP Location: Left Arm)   Pulse 85   Temp 97.8 F (36.6 C)   Resp 20   Ht 5' 9 (1.753 m)   Wt 95.3 kg   LMP 06/25/2024 (Approximate)   SpO2 98%   BMI 31.01 kg/m   Physical Exam Vitals and nursing note reviewed.  Constitutional:      General: She  is not in acute distress.    Appearance: She is well-developed.  HENT:     Head: Normocephalic and atraumatic.     Mouth/Throat:     Mouth: Mucous membranes are moist.  Eyes:     General: Vision grossly intact. Gaze aligned appropriately.     Extraocular Movements: Extraocular movements intact.     Conjunctiva/sclera: Conjunctivae normal.  Cardiovascular:     Rate and Rhythm: Normal rate and regular rhythm.     Pulses: Normal pulses.     Heart sounds: Normal heart sounds, S1 normal and S2 normal. No murmur heard.    No friction rub. No gallop.  Pulmonary:     Effort: Pulmonary effort is normal. No respiratory distress.     Breath sounds: Normal breath sounds.  Abdominal:     General: Bowel sounds are normal.     Palpations: Abdomen is soft.     Tenderness: There is no abdominal tenderness. There is no guarding or rebound.     Hernia: No hernia is present.  Musculoskeletal:        General: No swelling.     Cervical back: Full passive range of motion without pain, normal range of motion and neck supple. No spinous process tenderness or muscular tenderness. Normal range of motion.     Right lower leg: No edema.  Left lower leg: No edema.  Skin:    General: Skin is warm and dry.     Capillary Refill: Capillary refill takes less than 2 seconds.     Findings: No ecchymosis, erythema, rash or wound.  Neurological:     General: No focal deficit present.     Mental Status: She is alert and oriented to person, place, and time.     GCS: GCS eye subscore is 4. GCS verbal subscore is 5. GCS motor subscore is 6.     Cranial Nerves: Cranial nerves 2-12 are intact.     Sensory: Sensation is intact.     Motor: Motor function is intact.     Coordination: Coordination is intact.  Psychiatric:        Attention and Perception: Attention normal.        Mood and Affect: Mood is anxious and depressed.        Speech: Speech normal.        Behavior: Behavior normal.     (all labs ordered are  listed, but only abnormal results are displayed) Labs Reviewed  COMPREHENSIVE METABOLIC PANEL WITH GFR - Abnormal; Notable for the following components:      Result Value   Glucose, Bld 211 (*)    All other components within normal limits  CBC - Abnormal; Notable for the following components:   WBC 12.7 (*)    All other components within normal limits  ETHANOL  URINE DRUG SCREEN  PREGNANCY, URINE    EKG: None  Radiology: No results found.   Procedures   Medications Ordered in the ED  albuterol  (VENTOLIN  HFA) 108 (90 Base) MCG/ACT inhaler 1-2 puff (has no administration in time range)  metFORMIN  (GLUCOPHAGE ) tablet 1,000 mg (1,000 mg Oral Given 07/10/24 0036)  insulin  aspart (novoLOG ) injection 0-15 Units (has no administration in time range)  acetaminophen  (TYLENOL ) tablet 1,000 mg (1,000 mg Oral Given 07/10/24 0036)                                    Medical Decision Making Amount and/or Complexity of Data Reviewed Labs: ordered.  Risk OTC drugs. Prescription drug management.   Presents to the emergency department for evaluation of anxiety depression and suicidal thoughts.  Patient is voluntary and cooperative.  She will require psychiatric evaluation to determine the level of care that she needs.  She is medically clear for psychiatric evaluation and treatment.     Final diagnoses:  Suicidal ideation  Anxiety    ED Discharge Orders     None          Yukie Bergeron, Lonni PARAS, MD 07/10/24 [phone]

## 2024-07-11 DIAGNOSIS — F339 Major depressive disorder, recurrent, unspecified: Secondary | ICD-10-CM

## 2024-07-11 DIAGNOSIS — F411 Generalized anxiety disorder: Principal | ICD-10-CM

## 2024-07-11 LAB — GLUCOSE, CAPILLARY
Glucose-Capillary: 171 mg/dL — ABNORMAL HIGH (ref 70–99)
Glucose-Capillary: 144 mg/dL — ABNORMAL HIGH (ref 70–99)
Glucose-Capillary: 193 mg/dL — ABNORMAL HIGH (ref 70–99)

## 2024-07-11 MED ORDER — MELATONIN 3 MG PO TABS: 3.0000 mg | ORAL_TABLET | Freq: Every evening | ORAL | Status: DC | PRN

## 2024-07-11 NOTE — Progress Notes (Signed)
   07/11/24 2000  Psych Admission Type (Psych Patients Only)  Admission Status Voluntary  Psychosocial Assessment  Patient Complaints None  Eye Contact Fair  Facial Expression Animated  Affect Appropriate to circumstance  Speech Logical/coherent  Interaction Assertive  Motor Activity Other (Comment) (WNL)  Appearance/Hygiene Other (Comment) (Appropriate)  Behavior Characteristics Appropriate to situation  Mood Apprehensive;Preoccupied  Thought Process  Coherency WDL  Content WDL  Delusions None reported or observed  Perception WDL  Hallucination None reported or observed  Judgment Impaired  Confusion None  Danger to Self  Current suicidal ideation?  (Denies)  Agreement Not to Harm Self Yes  Description of Agreement Notify Staff  Danger to Others  Danger to Others None reported or observed
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
## 2024-07-11 NOTE — Progress Notes (Signed)
(  Sleep Hours) - 7.5 (Any PRNs that were needed, meds refused, or side effects to meds)- No PRN meds given, no meds refused.  (Any disturbances and when (visitation, over night)- None  (Concerns raised by the patient)- None  (SI/HI/AVH)- Denies SI/HI/AVH

## 2024-07-11 NOTE — Group Note (Unsigned)
 Date:  07/12/2024 Time:  4:21 AM  Group Topic/Focus:  Wrap-Up Group:   The focus of this group is to help patients review their daily goal of treatment and discuss progress on daily workbooks.    Participation Level:  Active  Participation Quality:  Appropriate and Sharing  Affect:  Appropriate  Cognitive:  Appropriate  Insight: Appropriate and Improving  Engagement in Group:  Engaged  Modes of Intervention:  Activity and Socialization  Additional Comments:  Patient shared that she had a good day today and it's been the most energetic day she's had. Patient shared that she's being interacting with others on the unit and talking more. Patient shared that she had a visit with her godmother and she spoke with her treatment team today.   Eward Mace 07/12/2024, 4:21 AM

## 2024-07-11 NOTE — Group Note (Signed)
 LCSW Group Therapy Note   Group Date: 07/11/2024 Start Time: 1100 End Time: 1200  Participation: Attended. Patient attended half of group session, actively engaged and participated in discussion. Respectful towards other group members.  Type of Therapy:  Group Therapy  Topic: Stronger Together: Building Healthy Relationships  Objective: To explore loneliness, boundaries, and safe ways to build relationships.   Goals:  1. Recognize healthy vs. unhealthy relationships.  2. Learn safe ways to connect with others.  3. Strengthen communication and boundary-setting skills.   Summary: Participants discussed loneliness, healthy connections, and setting boundaries. They explored safe ways to meet people and shared personal experiences. Key insights were reinforced through discussion and quotes.   Therapeutic Modalities Used:   Cognitive Behavioral Therapy (CBT) Elements - Identifying unhealthy relationship patterns, challenging negative thoughts about connection.   Dialectical Behavior Therapy (DBT) Elements - Interpersonal effectiveness, setting and maintaining boundaries.   Supportive Group Therapy - Peer discussion, shared experiences, and emotional validation.  Louetta Lame, LCSWA 07/11/2024  1:29 PM

## 2024-07-11 NOTE — Progress Notes (Signed)
   07/11/24 1500  Psych Admission Type (Psych Patients Only)  Admission Status Voluntary  Psychosocial Assessment  Patient Complaints None  Eye Contact Fair  Facial Expression Animated  Affect Anxious  Speech Logical/coherent  Interaction Guarded  Motor Activity Slow  Appearance/Hygiene Unremarkable  Behavior Characteristics Cooperative  Mood Anxious;Pleasant  Thought Process  Coherency WDL  Content WDL  Delusions None reported or observed  Perception WDL  Hallucination None reported or observed  Judgment Impaired  Confusion None  Danger to Self  Current suicidal ideation? Denies  Description of Suicide Plan No Plan  Agreement Not to Harm Self Yes  Description of Agreement Verbal contract  Danger to Others  Danger to Others None reported or observed
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
## 2024-07-11 NOTE — Group Note (Signed)
 Date:  07/11/2024 Time:  4:22 AM  Group Topic/Focus:  Wrap-Up Group:   The focus of this group is to help patients review their daily goal of treatment and discuss progress on daily workbooks.    Participation Level:  Active  Participation Quality:  Appropriate and Sharing  Affect:  Appropriate  Cognitive:  Appropriate  Insight: Appropriate  Engagement in Group:  Engaged  Modes of Intervention:  Discussion and Socialization  Additional Comments:  Patient admitted onto unit today. Patient rated her day a 5 out of 10. Patient goal for today to conquering the fear of coming here, Patient stated yes, but I was very nervous when asked was goal achieved. Patient shared coping skills that she finds most helpful, singing. Patient did not shared anything for  something that she likes about herself.   Eward Mace 07/11/2024, 4:22 AM
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
## 2024-07-11 NOTE — Plan of Care (Signed)
   Problem: Education: Goal: Emotional status will improve Outcome: Progressing   Problem: Activity: Goal: Interest or engagement in activities will improve Outcome: Progressing

## 2024-07-11 NOTE — Plan of Care (Signed)
   Problem: Education: Goal: Knowledge of [facility] Education information/materials will improve Outcome: Progressing Goal: Emotional status will improve Outcome: Progressing Goal: Mental status will improve Outcome: Progressing Goal: Verbalization of understanding the information provided will improve Outcome: Progressing   Problem: Activity: Goal: Interest or engagement in activities will improve Outcome: Progressing

## 2024-07-12 ENCOUNTER — Encounter (HOSPITAL_COMMUNITY): Payer: Self-pay

## 2024-07-12 DIAGNOSIS — E119 Type 2 diabetes mellitus without complications: Secondary | ICD-10-CM

## 2024-07-12 DIAGNOSIS — R8271 Bacteriuria: Secondary | ICD-10-CM

## 2024-07-12 DIAGNOSIS — F4310 Post-traumatic stress disorder, unspecified: Secondary | ICD-10-CM

## 2024-07-12 DIAGNOSIS — Z794 Long term (current) use of insulin: Secondary | ICD-10-CM

## 2024-07-12 DIAGNOSIS — Z7984 Long term (current) use of oral hypoglycemic drugs: Secondary | ICD-10-CM

## 2024-07-12 LAB — GLUCOSE, CAPILLARY
Glucose-Capillary: 147 mg/dL — ABNORMAL HIGH (ref 70–99)
Glucose-Capillary: 198 mg/dL — ABNORMAL HIGH (ref 70–99)
Glucose-Capillary: 125 mg/dL — ABNORMAL HIGH (ref 70–99)
Glucose-Capillary: 151 mg/dL — ABNORMAL HIGH (ref 70–99)

## 2024-07-12 LAB — TSH: TSH: 1.622 u[IU]/mL (ref 0.350–4.500)

## 2024-07-12 NOTE — BH Assessment (Signed)
(  Sleep Hours) - 7.5 (Any PRNs that were needed, meds refused, or side effects to meds)- None (Any disturbances and when (visitation, over night)- None (Concerns raised by the patient)- Pt requesting Melatonin for sleep. (SI/HI/AVH)-Denies

## 2024-07-12 NOTE — [facility] Group Notes (Signed)
 [facility] Group Notes:  (Nursing/MHT/Case Management/Adjunct)  Date:  07/12/2024  Time:  2000  Type of Therapy:  Alcoholics Anonymous Meeting  Participation Level:  Active  Participation Quality:  Appropriate, Attentive, Sharing, and Supportive  Affect:  Appropriate  Cognitive:  Alert  Insight:  Improving  Engagement in Group:  Engaged  Modes of Intervention:  Clarification, Education, and Support  Summary of Progress/Problems:  Barbara Cabrera 07/12/2024, 10:00 PM

## 2024-07-12 NOTE — Group Note (Signed)
 Date:  07/12/2024 Time:  4:14 PM  Group Topic/Focus:  Exploring Lonliness    Participation Level:  Active  Participation Quality:  Appropriate and Attentive  Affect:  Appropriate  Cognitive:  Alert and Oriented  Insight: Appropriate  Engagement in Group:  Engaged  Modes of Intervention:  Discussion and handout  Additional Comments:    [name] M [name] 07/12/2024, 4:14 PM

## 2024-07-12 NOTE — Progress Notes (Signed)
   07/12/24 1500  Psych Admission Type (Psych Patients Only)  Admission Status Voluntary  Psychosocial Assessment  Patient Complaints None  Eye Contact Fair  Facial Expression Animated  Affect Appropriate to circumstance  Speech Logical/coherent  Interaction Assertive  Motor Activity Slow  Appearance/Hygiene Unremarkable  Behavior Characteristics Appropriate to situation  Mood Anxious  Thought Process  Coherency WDL  Content WDL  Delusions None reported or observed  Perception WDL  Hallucination None reported or observed  Judgment Impaired  Confusion None  Danger to Self  Current suicidal ideation? Denies  Description of Suicide Plan No Plan  Agreement Not to Harm Self Yes  Description of Agreement Verbal  Danger to Others  Danger to Others None reported or observed

## 2024-07-12 NOTE — Group Note (Signed)
 Date:  07/12/2024 Time:  9:42 AM  Group Topic/Focus: Positivity  The goal of this group was for each person to write their name in the middle of a piece of paper and pass it around the dayroom to their peers. Each person was to write a positive comment anonymously about each other. Each patient was allowed to share what was said and reflect on their goals for today.   Participation Level:  Active  Participation Quality:  Appropriate, Sharing, and Supportive  Affect:  Appropriate  Cognitive:  Alert, Appropriate, and Oriented  Insight: Appropriate and Improving  Engagement in Group:  Engaged, Improving, and Supportive  Modes of Intervention:  Activity, Socialization, and Support  Additional Comments:  Barbara Cabrera actively participated in this activity. Barbara Cabrera expressed Barbara Cabrera enjoyed this activity very much and Barbara Cabrera was a social butterfly, writing kind things on her peer's papers.  Kristi HERO Aileen Amore 07/12/2024, 9:42 AM

## 2024-07-12 NOTE — Plan of Care (Signed)
   Problem: Education: Goal: Knowledge of [facility] Education information/materials will improve Outcome: Progressing Goal: Emotional status will improve Outcome: Progressing Goal: Verbalization of understanding the information provided will improve Outcome: Progressing   Problem: Activity: Goal: Interest or engagement in activities will improve Outcome: Progressing

## 2024-07-12 NOTE — Group Note (Signed)
 Date:  07/12/2024 Time:  10:53 AM  Group Topic/Focus:  Goals Group:   The focus of this group is to help patients establish daily goals to achieve during treatment and discuss how the patient can incorporate goal setting into their daily lives to aide in recovery.    Participation Level:  Active  Participation Quality:  Appropriate and Sharing  Affect:  Appropriate  Cognitive:  Alert, Appropriate, and Oriented  Insight: Appropriate and Improving  Engagement in Group:  Engaged and Improving  Modes of Intervention:  Discussion and Socialization  Additional Comments:  Barbara Cabrera shares her goal today is do get her discharge date, which she did.  Kristi HERO Tryson Lumley 07/12/2024, 10:53 AM

## 2024-07-12 NOTE — Plan of Care (Signed)
   Problem: Education: Goal: Knowledge of [facility] Education information/materials will improve Outcome: Progressing Goal: Emotional status will improve Outcome: Progressing Goal: Mental status will improve Outcome: Progressing Goal: Verbalization of understanding the information provided will improve Outcome: Progressing

## 2024-07-12 NOTE — Progress Notes (Signed)
 I received a consult to see Barbara Cabrera and provide support.  This has been her first hospitalization for mental health and I provided listening as she shared about her experience. She requested prayer that she continue to move forward on her journey of healing and I gladly prayed with her.

## 2024-07-12 NOTE — Progress Notes (Signed)
 [facility] Inpatient Psychiatry Progress Note  Date: 07/12/2024 Patient: Barbara Cabrera MRN: [id number]  Barbara Cabrera is a 28 y.o. female  with a past psychiatric history of anxiety. Patient initially arrived to [facility] ED at [facility] on 07/09/2024  at 11:14 PM for anxiety, panic attack and crying all day, and thoughts of wanting to overdose on pills. She was admitted to [facility] on 8/20 for acute safety concerns, crisis stabalization, and impaired functioning. PMHx is significant for Type 2 diabetes and asthma.   Assessment and Plan:   She currently meets diagnostic criteria for PTSD with flashbacks, intrusive thoughts of previous traumatic events, nightmares of caricatures of the events, and avoidance of situations that she feels may trigger her. She also has some acute stressors with moving and obtaining her drivers license that may have led to some increased feelings of anxiety and feeling overwhelmed leading up to the breakdown/panic attack. Patient is reluctant to initiate any additional medications because she does not like to take pills, but is interested in individual therapy and has found the groups helpful so far. She would be interested in natural remedies and use of lavender oil was discussed as a potential natural remedy for anxiety.   8/22: Patient is doing well and reports psychiatric hospitalization has been beneficial for processing her emotions around anxiety and stress. She is engaged with groups and very social with other patients. We will plan to discharge patient tomorrow morning to home with outpatient therapy follow-up.   # PTSD # Generalized Anxiety Disorder - Hydroxyzine  25 mg 3 times daily as needed for anxiety - Referral for outpatient therapy   -PRNs: maalox, milk of magnesia, hydroxyzine , trazodone , acetaminophen , albuterol  -- As needed agitation protocol in-place   # Type 2 Diabetes -Poorly controlled with A1c of 12.3%  on 06/21/2024 -Sliding scale insulin  (novoLOG ) 0-15 units given with meals -Metformin  1000mg  BID with meals   # Urinalysis with Many Bacteria -Patient endorses some urinary frequency but no urgency or burning or pain with urination, WBC 12.7 on 07/09/2024 -Continue to monitor for UTI symptoms  Discharge Planning: Barriers to Discharge: None Estimated Length of Stay: 1-2 days Predicted Discharge Location: Home  Interval History: Chart reviewed. No significant events overnight. On assessment today, the patient reports her mood is good. Her affect is bright and friendly. She is sleeping and eating well. She denies SI/HI/AVH. States she is enjoying groups, feels more grounded since coming to the hospital. Confirmed discharge time of 10:00 AM Saturday with patient. Her friend will come pick her up. She is looking forward to starting therapy outpatient. Discussed A1C lab results with patient. She is aware and says she was started back on metformin  and insulin  3 weeks ago. She already has a follow-up appointment with PCP for diabetes management.  PRNs in the last 24 hours include none.  Physical Examination:  Vitals and nursing note reviewed  Musculoskeletal: Normal gait and station Strength & Muscle Tone: within normal limits Gait & Station: normal  Mental Status Exam:  Appearance: Casual, well developed, fairly groomed  Behavior: Sitting upright in chair, good eye contact, no observed tics or restlessness  Attitude: Cooperative, pleasant  Speech: Normal rate, normal volume, clear and coherent  Mood: Good  Affect: Euthymic, Full, Stable  Thought Process: Goal directed, logical  Thought Content: No paranoia or delusions endorsed  SI/HI: Denies  Perceptions: Denies AVH  Judgement: Fair  Insight: Good, showed understanding of her own tendencies that led to this hospitalization  Fund of Knowledge: WNL   Lab Results:  Admission on 07/10/2024  Component Date Value Ref Range Status    Glucose-Capillary 07/10/2024 190 (H)  70 - 99 mg/dL Final   Glucose-Capillary 07/10/2024 194 (H)  70 - 99 mg/dL Final   Comment 1 91/79/7974 Notify RN   Final   Glucose-Capillary 07/11/2024 171 (H)  70 - 99 mg/dL Final   Comment 1 91/78/7974 Notify RN   Final   Glucose-Capillary 07/11/2024 144 (H)  70 - 99 mg/dL Final   Glucose-Capillary 07/11/2024 193 (H)  70 - 99 mg/dL Final   TSH 91/77/7974 1.622  0.350 - 4.500 uIU/mL Final   Glucose-Capillary 07/12/2024 147 (H)  70 - 99 mg/dL Final   Comment 1 91/77/7974 Notify RN   Final     Vitals: Blood pressure 119/78, pulse 86, temperature 97.8 F (36.6 C), temperature source Oral, resp. rate 16, height 5' 9 (1.753 m), weight 99 kg, last menstrual period 06/25/2024, SpO2 100%.   Ashley Gravely, MD PGY-1, Psychiatry Residency  07/12/2024, 11:15 AM

## 2024-07-12 NOTE — Group Note (Signed)
 Date:  07/12/2024 Time:  5:14 PM  Group Topic/Focus:  Dimensions of Wellness:   The focus of this group is to introduce the topic of wellness and discuss the role each dimension of wellness plays in total health.Patients discussed triggers that can cause setbacks in recovery and discussed deep breathing as a coping skill. Wellness Toolbox:   The focus of this group is to discuss various aspects of wellness, balancing those aspects and exploring ways to increase the ability to experience wellness.  Patients will create a wellness toolbox for use upon discharge.     Participation Level:  Active  Participation Quality:  Appropriate  Affect:  Appropriate  Cognitive:  Alert, Appropriate, and Oriented  Insight: Appropriate and Improving  Engagement in Group:  Engaged, Improving, and Supportive  Modes of Intervention:  Discussion, Education, Problem-solving, and Socialization  Additional Comments:  Barbara Cabrera attended and participated in this group. She shared triggers that may cause setbacks in her recovery and she was respectful and supportive toward her peers.  Kristi HERO [employer] 07/12/2024, 5:14 PM

## 2024-07-12 NOTE — BH IP Treatment Plan (Signed)
 Interdisciplinary Treatment and Diagnostic Plan Update  07/12/2024 Time of Session: 1005AM Barbara Cabrera MRN: [id number]  Principal Diagnosis: Generalized anxiety disorder  Secondary Diagnoses: Principal Problem:   Generalized anxiety disorder   Current Medications:  Current Facility-Administered Medications  Medication Dose Route Frequency Provider Last Rate Last Admin   acetaminophen  (TYLENOL ) tablet 650 mg  650 mg Oral Q6H PRN Arloa Suzen RAMAN, NP       albuterol  (VENTOLIN  HFA) 108 (90 Base) MCG/ACT inhaler 1-2 puff  1-2 puff Inhalation Q6H PRN Arloa Suzen RAMAN, NP       alum & mag hydroxide-simeth (MAALOX/MYLANTA) 200-200-20 MG/5ML suspension 30 mL  30 mL Oral Q4H PRN Arloa Suzen RAMAN, NP       haloperidol  (HALDOL ) tablet 5 mg  5 mg Oral TID PRN Arloa Suzen RAMAN, NP       And   diphenhydrAMINE  (BENADRYL ) capsule 50 mg  50 mg Oral TID PRN Arloa Suzen RAMAN, NP       haloperidol  lactate (HALDOL ) injection 5 mg  5 mg Intramuscular TID PRN Arloa Suzen RAMAN, NP       And   diphenhydrAMINE  (BENADRYL ) injection 50 mg  50 mg Intramuscular TID PRN Arloa Suzen RAMAN, NP       And   LORazepam  (ATIVAN ) injection 2 mg  2 mg Intramuscular TID PRN Arloa Suzen RAMAN, NP       haloperidol  lactate (HALDOL ) injection 10 mg  10 mg Intramuscular TID PRN Arloa Suzen RAMAN, NP       And   diphenhydrAMINE  (BENADRYL ) injection 50 mg  50 mg Intramuscular TID PRN Arloa Suzen RAMAN, NP       And   LORazepam  (ATIVAN ) injection 2 mg  2 mg Intramuscular TID PRN Arloa Suzen RAMAN, NP       hydrOXYzine  (ATARAX ) tablet 25 mg  25 mg Oral TID PRN Arloa Suzen RAMAN, NP       insulin  aspart (novoLOG ) injection 0-15 Units  0-15 Units Subcutaneous TID WC Arloa Suzen RAMAN, NP   2 Units at 07/12/24 9352   magnesium  hydroxide (MILK OF MAGNESIA) suspension 30 mL  30 mL Oral Daily PRN Arloa Suzen RAMAN, NP       melatonin tablet 3 mg  3 mg Oral QHS PRN Onuoha, Chinwendu V, NP       metFORMIN   (GLUCOPHAGE ) tablet 1,000 mg  1,000 mg Oral BID WC Arloa Suzen RAMAN, NP   1,000 mg at 07/12/24 9247   traZODone  (DESYREL ) tablet 50 mg  50 mg Oral QHS PRN Arloa Suzen RAMAN, NP       PTA Medications: Medications Prior to Admission  Medication Sig Dispense Refill Last Dose/Taking   albuterol  (PROVENTIL  HFA;VENTOLIN  HFA) 108 (90 Base) MCG/ACT inhaler Inhale 1-2 puffs into the lungs every 6 (six) hours as needed for wheezing or shortness of breath. 1 Inhaler 1    metFORMIN  (GLUCOPHAGE ) 1000 MG tablet Take 1 tablet (1,000 mg total) by mouth 2 (two) times daily. 60 tablet 1     Patient Stressors:    Patient Strengths: Ability for insight  Communication skills  Special hobby/interest   Treatment Modalities: Medication Management, Group therapy, Case management,  1 to 1 session with clinician, Psychoeducation, Recreational therapy.   Physician Treatment Plan for Primary Diagnosis: Generalized anxiety disorder Long Term Goal(s):     Short Term Goals: Ability to verbalize feelings will improve Ability to identify and develop effective coping behaviors will improve  Medication Management: Evaluate patient's response, side effects, and  tolerance of medication regimen.  Therapeutic Interventions: 1 to 1 sessions, Unit Group sessions and Medication administration.  Evaluation of Outcomes: Not Progressing  Physician Treatment Plan for Secondary Diagnosis: Principal Problem:   Generalized anxiety disorder  Long Term Goal(s):     Short Term Goals: Ability to verbalize feelings will improve Ability to identify and develop effective coping behaviors will improve     Medication Management: Evaluate patient's response, side effects, and tolerance of medication regimen.  Therapeutic Interventions: 1 to 1 sessions, Unit Group sessions and Medication administration.  Evaluation of Outcomes: Not Progressing   RN Treatment Plan for Primary Diagnosis: Generalized anxiety disorder Long Term  Goal(s): Knowledge of disease and therapeutic regimen to maintain health will improve  Short Term Goals: Ability to remain free from injury will improve, Ability to verbalize frustration and anger appropriately will improve, Ability to demonstrate self-control, Ability to participate in decision making will improve, Ability to verbalize feelings will improve, Ability to disclose and discuss suicidal ideas, Ability to identify and develop effective coping behaviors will improve, and Compliance with prescribed medications will improve  Medication Management: RN will administer medications as ordered by provider, will assess and evaluate patient's response and provide education to patient for prescribed medication. RN will report any adverse and/or side effects to prescribing provider.  Therapeutic Interventions: 1 on 1 counseling sessions, Psychoeducation, Medication administration, Evaluate responses to treatment, Monitor vital signs and CBGs as ordered, Perform/monitor CIWA, COWS, AIMS and Fall Risk screenings as ordered, Perform wound care treatments as ordered.  Evaluation of Outcomes: Not Progressing   LCSW Treatment Plan for Primary Diagnosis: Generalized anxiety disorder Long Term Goal(s): Safe transition to appropriate next level of care at discharge, Engage patient in therapeutic group addressing interpersonal concerns.  Short Term Goals: Engage patient in aftercare planning with referrals and resources, Increase social support, Increase ability to appropriately verbalize feelings, Increase emotional regulation, Facilitate acceptance of mental health diagnosis and concerns, Facilitate patient progression through stages of change regarding substance use diagnoses and concerns, Identify triggers associated with mental health/substance abuse issues, and Increase skills for wellness and recovery  Therapeutic Interventions: Assess for all discharge needs, 1 to 1 time with Social worker, Explore  available resources and support systems, Assess for adequacy in community support network, Educate family and significant other(s) on suicide prevention, Complete Psychosocial Assessment, Interpersonal group therapy.  Evaluation of Outcomes: Not Progressing   Progress in Treatment: Attending groups: Yes. Participating in groups: Yes. Taking medication as prescribed: Yes. Toleration medication: Yes. Family/Significant other contact made: No, will contact:  Daphne Kleine (godmother) [phone] Patient understands diagnosis: Yes. Discussing patient identified problems/goals with staff: Yes. Medical problems stabilized or resolved: Yes. Denies suicidal/homicidal ideation: Yes. Issues/concerns per patient self-inventory: No.  New problem(s) identified: No, Describe:  none  New Short Term/Long Term Goal(s): medication stabilization, elimination of SI thoughts, development of comprehensive mental wellness plan.    Patient Goals:  Get set up with therapy and grief counseling  Discharge Plan or Barriers: Patient recently admitted. CSW will continue to follow and assess for appropriate referrals and possible discharge planning.    Reason for Continuation of Hospitalization: Anxiety Medication stabilization  Estimated Length of Stay: 3-5 days  Last 3 [place] Suicide Severity Risk Score: Flowsheet Row Ad[facility] (Current[facility] 07[facility]09/2024 in [facility] from 06/22/2024 in [facility] at [facility])  C-SSRS RISK CATEGORY No Risk Moderate  Risk No Risk    Last PHQ 2/9 Scores:     No data to display          Scribe for Treatment Team: Jenkins LULLA Primer, LCSWA 07/12/2024 11:29 AM

## 2024-07-12 NOTE — [facility] Suicide Risk Assessment (Signed)
 [facility] INPATIENT:  Family/Significant Other Suicide Prevention Education  Suicide Prevention Education:  Education Completed; Barbara Cabrera (godmother) [phone],  (name of family member/significant other) has been identified by the patient as the family member/significant other with whom the patient will be residing, and identified as the person(s) who will aid the patient in the event of a mental health crisis (suicidal ideations/suicide attempt).  With written consent from the patient, the family member/significant other has been provided the following suicide prevention education, prior to the and/or following the discharge of the patient.  The suicide prevention education provided includes the following: Suicide risk factors Suicide prevention and interventions National Suicide Hotline telephone number [facility] assessment telephone number [facility] Emergency Assistance 911 [facility] and/or Residential Mobile Crisis Unit telephone number  Request made of family/significant other to: Remove weapons (e.g., guns, rifles, knives), all items previously/currently identified as safety concern.   Remove drugs/medications (over-the-counter, prescriptions, illicit drugs), all items previously/currently identified as a safety concern.  Godmother confirmed patient is welcome to return to their home and will be providing transportation for client upon discharge. Godmother acknowledged there are weapons in the home but they are locked up away from patient access. Godmother agreed to monitor and safely store OTC and prescription medications away from patient.   The family member/significant other verbalizes understanding of the suicide prevention education information provided.  The family member/significant other agrees to remove the items of safety concern listed above.  Barbara Cabrera 07/12/2024, 2:42 PM

## 2024-07-13 DIAGNOSIS — F411 Generalized anxiety disorder: Principal | ICD-10-CM

## 2024-07-13 LAB — GLUCOSE, CAPILLARY: Glucose-Capillary: 150 mg/dL — ABNORMAL HIGH (ref 70–99)

## 2024-07-13 NOTE — [facility] Suicide Risk Assessment (Signed)
 Suicide Risk Assessment  Discharge Assessment    [facility] Discharge Suicide Risk Assessment   Principal Problem: Generalized anxiety disorder Discharge Diagnoses: Principal Problem:   Generalized anxiety disorder   Total Time spent with patient: 1 hour  Barbara Cabrera is a 28 y.o. female with a past psychiatric history of anxiety. Patient initially arrived to [facility] ED at [facility] on 07/09/2024 at 11:14 PM for anxiety, panic attack and crying all day, and thoughts of wanting to overdose on pills. She was admitted to [facility] on 8/20 for acute safety concerns, crisis stabalization, and impaired functioning. PMHx is significant for Type 2 diabetes and asthma.   On admission, patient met diagnostic criteria for PTSD and GAD. Reported symptoms consistent with PTSD included flashbacks, intrusive thoughts of previous traumatic events, nightmares of caricatures of the events, and avoidance of situations that she feels may trigger her. Reported symptoms of GAD included excessive worrying, difficulty sleeping, restlessness, anxiety tics, and history of panic attacks. Patient endorsed life stressors of moving, obtaining her drivers license, feeling behind in life, causing overwhelmed feelings that lead to her having a mental health crisis.   Patient declined starting psychotropic medications during this admission. Her stated reason was not liking to take pills and preferring natural remedies. Use of Silexin was discussed as a potential natural remedy for anxiety. She expressed particular interest in beginning individualized therapy after discharge. During hospitalization, patient was very pleasant, friendly, and social with other patients. She was very engaged in on-unit group programming. Improvement was noted by patient's report of decreasing anxiety and improved sleep.    On day of discharge, patient reports she is doing well and feels chipper. Her affect is bright and friendly. She  denies SI/HI/AVH.  Reviewed upcoming therapy appointments with patient, and she stated she is really looking forward to beginning therapy. She reports hospitalization has helped her clear her head.  Reviewed A1C lab result with the patient, and she reports she has an upcoming appointment with PCP for management of her diabetes. Patient was instructed to call 911, [phone], or go to the nearest ED or [facility] if she is ever in crisis again.  Musculoskeletal: Strength & Muscle Tone: within normal limits Gait & Station: normal Patient leans: N/A  Psychiatric Specialty Exam  Presentation  General Appearance:  Appropriate for Environment; Casual  Eye Contact: Good  Speech: Clear and Coherent; Normal Rate  Speech Volume: Normal   Mood and Affect  Mood: Euthymic  Duration of Depression Symptoms: Greater than two weeks  Affect: Appropriate; Congruent; Full Range   Thought Process  Thought Processes: Coherent; Goal Directed; Linear  Descriptions of Associations:Intact  Orientation:Full (Time, Place and Person)  Thought Content:WDL  History of Schizophrenia/Schizoaffective disorder:No  Duration of Psychotic Symptoms:No data recorded Hallucinations:Hallucinations: None  Ideas of Reference:None  Suicidal Thoughts:Suicidal Thoughts: No  Homicidal Thoughts:Homicidal Thoughts: No   Sensorium  Memory: Immediate Good; Recent Good  Judgment: Good  Insight: Good   Executive Functions  Concentration: Good  Attention Span: Good  Recall: Good  Fund of Knowledge: Good  Language: Good   Psychomotor Activity  Psychomotor Activity:Psychomotor Activity: Normal   Assets  Assets: Communication Skills; Desire for Improvement; Social Support   Sleep  Sleep:Sleep: Good  Estimated Sleeping Duration (Last 24 Hours): 6.25-7.25 hours  Physical Exam: Physical Exam Vitals and nursing note reviewed.  Constitutional:       General: She is not in acute distress.    Appearance: Normal appearance. She  is not ill-appearing.  HENT:     Head: Normocephalic and atraumatic.  Pulmonary:     Effort: Pulmonary effort is normal. No respiratory distress.  Neurological:     General: No focal deficit present.     Mental Status: She is alert and oriented to person, place, and time. Mental status is at baseline.    Review of Systems  Gastrointestinal:  Negative for abdominal pain, constipation, diarrhea, nausea and vomiting.  Neurological:  Negative for dizziness and headaches.  All other systems reviewed and are negative.  Blood pressure 112/72, pulse 78, temperature 98.1 F (36.7 C), temperature source Oral, resp. rate 16, height 5' 9 (1.753 m), weight 99 kg, last menstrual period 06/25/2024, SpO2 100%. Body mass index is 32.23 kg/m.  Mental Status Per Nursing Assessment::   On Admission:  Suicidal ideation indicated by patient  Demographic Factors:  NA  Loss Factors: NA  Historical Factors: Prior suicide attempts  Risk Reduction Factors:   Sense of responsibility to family, Religious beliefs about death, Employed, Living with another person, especially a relative, Positive social support, and Positive coping skills or problem solving skills  Continued Clinical Symptoms:  None  Cognitive Features That Contribute To Risk:  None    Suicide Risk:  Minimal: No identifiable suicidal ideation.  Patients presenting with no risk factors but with morbid ruminations; may be classified as minimal risk based on the severity of the depressive symptoms   Follow-up Information     [facility] Follow up on 07/18/2024.   Why: You have a hospital follow up appointment for therapy services on  8/28 at 8:30 am.  The appointment will be Virtual telehealth. Contact information: [address]  [address] [place] [address] [phone]         [place] Go to.   Specialty: Professional  Counselor Why: You may also go to this provider for an assessment, to receive therapy services, on Monday through Friday from 9 am to 1 pm. Contact information: [address]  [address] [place] [address] [phone]         [facility]. Call on 07/15/2024.   Specialty: Hospice and Palliative Medicine Why: Please call on 07/15/24 at 9:00 am to personally schedule an appointment for grief/bereavement therapy services. Contact information: [address] [facility] [place]  [address] [phone]        Tammy Tari DASEN, PA-C. Call on 07/16/2024.   Specialty: Physician Assistant Why: Please call your primary care provider on 07/16/24 at 9:00 am to schedule a hospital follow up appointment. Contact information: [address] [place] [address] [phone]                 Plan Of Care/Follow-up recommendations:   -Recommended to follow up with your outpatient mental health provider -instructions on appointment date, time, and address (location) are provided in the discharge paperwork   -Follow-up with outpatient primary care doctor and other specialists for management of Type 2 Diabetes Mellitus (A1C of 12.3)   -In the event of worsening symptoms, the patient is instructed to call the crisis hotline, 911, and or go to the nearest ED for appropriate evaluation and treatment of symptoms. To follow-up with primary care provider for other medical issues, concerns and or health care needs  Ashley LOISE Gravely, MD 07/13/2024, 9:06 AM

## 2024-07-13 NOTE — Discharge Summary (Signed)
 Physician Discharge Summary Note Patient:  Barbara Cabrera is an 28 y.o., female MRN:  [id number] DOB:  [birth year] Patient phone:  [phone] (home)          Patient address:   [address] [place] [address],            Total Time spent with patient: 1 hour   Date of Admission:  07/10/2024 Date of Discharge: 07/13/2024   Reason for Admission: Barbara Cabrera is a 28 y.o. female with a past psychiatric history of anxiety. Patient initially arrived to [facility] ED at [facility] on 07/09/2024 at 11:14 PM for anxiety, panic attack and crying all day, and thoughts of wanting to overdose on pills. She was admitted to [facility] on 8/20 for acute safety concerns, crisis stabalization, and impaired functioning. PMHx is significant for Type 2 diabetes and asthma.    Discharge Diagnoses:  Principal Problem:   Generalized anxiety disorder     Hospital Course:     On admission, patient met diagnostic criteria for PTSD and GAD. Reported symptoms consistent with PTSD included flashbacks, intrusive thoughts of previous traumatic events, nightmares of caricatures of the events, and avoidance of situations that she feels may trigger her. Reported symptoms of GAD included excessive worrying, difficulty sleeping, restlessness, anxiety tics, and history of panic attacks. Patient endorsed life stressors of moving, obtaining her drivers license, feeling behind in life, causing overwhelmed feelings that lead to her having a mental health crisis.   Patient declined starting psychotropic medications during this admission. Her stated reason was not liking to take pills and preferring natural remedies. Use of Silexin was discussed as a potential natural remedy for anxiety. She expressed particular interest in beginning individualized therapy after discharge. During hospitalization, patient was very pleasant, friendly, and social with other patients. She was very engaged in on-unit group programming.  Improvement was noted by patient's report of decreasing anxiety and improved sleep.    On day of discharge, patient reports she is doing well and feels chipper. Her affect is bright and friendly. She denies SI/HI/AVH.  Reviewed upcoming therapy appointments with patient, and she stated she is really looking forward to beginning therapy. She reports hospitalization has helped her clear her head.  Reviewed A1C lab result with the patient, and she reports she has an upcoming appointment with PCP for management of her diabetes. Patient was instructed to call 911, [phone], or go to the nearest ED or [facility] if she is ever in crisis again.     Behavioral Events: None   Restraints: None   Physical Findings: None   Mental Status Exam: Appearance: Casual, well developed, fairly groomed  Behavior:  Good eye contact, no observed tics or restlessness  Attitude: Cooperative, pleasant  Speech: Normal rate, normal volume, clear and coherent  Mood: Chipper  Affect: Euthymic, Full, Stable  Thought Process: Goal directed, logical  Thought Content: No paranoia or delusions endorsed  SI/HI: Denies  Perceptions: Denies AVH  Judgement: Good  Insight: Good, showed understanding of her own tendencies that led to this hospitalization  Fund of Knowledge: WNL      Physical Exam    General: Pleasant, well-appearing . No acute distress. Pulmonary: Normal effort. No wheezing or rales. Skin: No obvious rash or lesions. Neuro: A&Ox3.No focal deficit.     Review of Systems  No reported symptoms   Blood pressure 112/72, pulse 78, temperature 98.1 F (36.7 C), temperature source Oral, resp. rate 16, height 5'  9 (1.753 m), weight 99 kg, last menstrual period 06/25/2024, SpO2 100%. Body mass index is 32.23 kg/m.   Assets  Assets: social support, positive coping skills, desire for improvement   Tobacco Use History  Social History       Tobacco Use  Smoking Status Never   Smokeless Tobacco Never      Tobacco Cessation:  N/A, patient does not currently use tobacco products   Metabolic Disorder Labs:  Recent Labs       Lab Results  Component Value Date    HGBA1C 12.3 (H) 06/21/2024    MPG 220.21 01/01/2020      Recent Labs  No results found for: PROLACTIN   Recent Labs  No results found for: CHOL, TRIG, HDL, CHOLHDL, VLDL, LDLCALC       Is patient on multiple antipsychotic therapies at discharge:  No   Has Patient had three or more failed trials of antipsychotic monotherapy by history:  No   Recommended Plan for Multiple Antipsychotic Therapies: NA   Discharge Instructions       Diet - low sodium heart healthy   Complete by: As directed      Increase activity slowly   Complete by: As directed           Allergies as of 07/13/2024   No Known Allergies         Medication List       TAKE these medications       Indication  albuterol  108 (90 Base) MCG/ACT inhaler Commonly known as: VENTOLIN  HFA Inhale 1-2 puffs into the lungs every 6 (six) hours as needed for wheezing or shortness of breath.   Indication: Asthma    metFORMIN  1000 MG tablet Commonly known as: GLUCOPHAGE  Take 1 tablet (1,000 mg total) by mouth 2 (two) times daily.   Indication: Type 2 Diabetes             Follow-up Information       [facility] Follow up on 07/18/2024.   Why: You have a hospital follow up appointment for therapy services on  8/28 at 8:30 am.  The appointment will be Virtual telehealth. Contact information: [address]  [address] [place] [address] [phone]              [place] Go to.   Specialty: Professional Counselor Why: You may also go to this provider for an assessment, to receive therapy services, on Monday through Friday from 9 am to 1 pm. Contact information: [address]  [address] [place] [address] [phone]              [facility] Call on  07/15/2024.   Specialty: Hospice and Palliative Medicine Why: Please call on 07/15/24 at 9:00 am to personally schedule an appointment for grief/bereavement therapy services. Contact information: [address] [facility] [place]  [address] [phone]            Tammy Tari DASEN, PA-C. Call on 07/16/2024.   Specialty: Physician Assistant Why: Please call your primary care provider on 07/16/24 at 9:00 am to schedule a hospital follow up appointment. Contact information: [address] [place] [address] [phone]                          Discharge recommendations:    -Recommended to follow up with your outpatient mental health provider -instructions on appointment date, time, and address (location) are provided in the  discharge paperwork   -Follow-up with outpatient primary care doctor and other specialists for management of Type 2 Diabetes Mellitus (A1C of 12.3)   -In the event of worsening symptoms, the patient is instructed to call the crisis hotline, 911, and or go to the nearest ED for appropriate evaluation and treatment of symptoms. To follow-up with primary care provider for other medical issues, concerns and or health care needs     Signed: Ashley LOISE Gravely, MD, PGY-1 07/13/2024, 8:41 AM

## 2024-07-13 NOTE — H&P (Deleted)
 Physician Discharge Summary Note Patient:  Barbara Cabrera is an 28 y.o., female MRN:  [id number] DOB:  1996/05/16 Patient phone:  [phone] (home)  Patient address:   [address] [place] [address],  Total Time spent with patient: 1 hour  Date of Admission:  07/10/2024 Date of Discharge: 07/13/2024  Reason for Admission: Barbara Cabrera is a 28 y.o. female with a past psychiatric history of anxiety. Patient initially arrived to [facility] ED at [facility] on 07/09/2024 at 11:14 PM for anxiety, panic attack and crying all day, and thoughts of wanting to overdose on pills. She was admitted to [facility] on 8/20 for acute safety concerns, crisis stabalization, and impaired functioning. PMHx is significant for Type 2 diabetes and asthma.   Discharge Diagnoses:  Principal Problem:   Generalized anxiety disorder   Hospital Course:    On admission, patient met diagnostic criteria for PTSD and GAD. Reported symptoms consistent with PTSD included flashbacks, intrusive thoughts of previous traumatic events, nightmares of caricatures of the events, and avoidance of situations that she feels may trigger her. Reported symptoms of GAD included excessive worrying, difficulty sleeping, restlessness, anxiety tics, and history of panic attacks. Patient endorsed life stressors of moving, obtaining her drivers license, feeling behind in life, causing overwhelmed feelings that lead to her having a mental health crisis.  Patient declined starting psychotropic medications during this admission. Her stated reason was not liking to take pills and preferring natural remedies. Use of Silexin was discussed as a potential natural remedy for anxiety. She expressed particular interest in beginning individualized therapy after discharge. During hospitalization, patient was very pleasant, friendly, and social with other patients. She was very engaged in on-unit group programming. Improvement was noted by  patient's report of decreasing anxiety and improved sleep.   On day of discharge, patient reports she is doing well and feels chipper. Her affect is bright and friendly. She denies SI/HI/AVH.  Reviewed upcoming therapy appointments with patient, and she stated she is really looking forward to beginning therapy. She reports hospitalization has helped her clear her head.  Reviewed A1C lab result with the patient, and she reports she has an upcoming appointment with PCP for management of her diabetes. Patient was instructed to call 911, 988, or go to the nearest ED or [facility] if she is ever in crisis again.   Behavioral Events: None  Restraints: None  Physical Findings: None  Mental Status Exam: Appearance: Casual, well developed, fairly groomed  Behavior:  Good eye contact, no observed tics or restlessness  Attitude: Cooperative, pleasant  Speech: Normal rate, normal volume, clear and coherent  Mood: Chipper  Affect: Euthymic, Full, Stable  Thought Process: Goal directed, logical  Thought Content: No paranoia or delusions endorsed  SI/HI: Denies  Perceptions: Denies AVH  Judgement: Good  Insight: Good, showed understanding of her own tendencies that led to this hospitalization  Fund of Knowledge: WNL    Physical Exam   General: Pleasant, well-appearing . No acute distress. Pulmonary: Normal effort. No wheezing or rales. Skin: No obvious rash or lesions. Neuro: A&Ox3.No focal deficit.   Review of Systems  No reported symptoms  Blood pressure 112/72, pulse 78, temperature 98.1 F (36.7 C), temperature source Oral, resp. rate 16, height 5' 9 (1.753 m), weight 99 kg, last menstrual period 06/25/2024, SpO2 100%. Body mass index is 32.23 kg/m.  Assets  Assets: social support, positive coping skills, desire for improvement  Social History   Tobacco Use  Smoking Status Never  Smokeless Tobacco Never   Tobacco Cessation:  N/A, patient does  not currently use tobacco products  Metabolic Disorder Labs:  Lab Results  Component Value Date   HGBA1C 12.3 (H) 06/21/2024   MPG 220.21 01/01/2020   No results found for: PROLACTIN No results found for: CHOL, TRIG, HDL, CHOLHDL, VLDL, LDLCALC   Is patient on multiple antipsychotic therapies at discharge:  No   Has Patient had three or more failed trials of antipsychotic monotherapy by history:  No  Recommended Plan for Multiple Antipsychotic Therapies: NA  Discharge Instructions     Diet - low sodium heart healthy   Complete by: As directed    Increase activity slowly   Complete by: As directed       Allergies as of 07/13/2024   No Known Allergies      Medication List     TAKE these medications      Indication  albuterol  108 (90 Base) MCG/ACT inhaler Commonly known as: VENTOLIN  HFA Inhale 1-2 puffs into the lungs every 6 (six) hours as needed for wheezing or shortness of breath.  Indication: Asthma   metFORMIN  1000 MG tablet Commonly known as: GLUCOPHAGE  Take 1 tablet (1,000 mg total) by mouth 2 (two) times daily.  Indication: Type 2 Diabetes        Follow-up Information     [facility] Follow up on 07/18/2024.   Why: You have a hospital follow up appointment for therapy services on  8/28 at 8:30 am.  The appointment will be Virtual telehealth. Contact information: [address]  [address] [place] [address] [phone]         [place] Go to.   Specialty: Professional Counselor Why: You may also go to this provider for an assessment, to receive therapy services, on Monday through Friday from 9 am to 1 pm. Contact information: [address]  [address] [place] [address] [phone]         [facility]. Call on 07/15/2024.   Specialty: Hospice and Palliative Medicine Why: Please call on 07/15/24 at 9:00 am to personally schedule an appointment for grief/bereavement therapy  services. Contact information: [address] [facility] [place]  [address] [phone]        Tammy Tari DASEN, PA-C. Call on 07/16/2024.   Specialty: Physician Assistant Why: Please call your primary care provider on 07/16/24 at 9:00 am to schedule a hospital follow up appointment. Contact information: [address] [place] [address] [phone]                 Discharge recommendations:   -Recommended to follow up with your outpatient mental health provider -instructions on appointment date, time, and address (location) are provided in the discharge paperwork  -Follow-up with outpatient primary care doctor and other specialists for management of Type 2 Diabetes Mellitus (A1C of 12.3)   -In the event of worsening symptoms, the patient is instructed to call the crisis hotline, 911, and or go to the nearest ED for appropriate evaluation and treatment of symptoms. To follow-up with primary care provider for other medical issues, concerns and or health care needs   Signed: Ashley LOISE Gravely, MD, PGY-1 07/13/2024, 8:41 AM

## 2024-07-13 NOTE — Progress Notes (Signed)
  [facility] Adult Case Management Discharge Plan :  Will you be returning to the same living situation after discharge:  Yes,  the patient will going ot her god mother's home At discharge, do you have transportation home?: Yes,  the patient's god mother will be picking her up Do you have the ability to pay for your medications: Yes,  the patient stated that she has insurance  Release of information consent forms completed and in the chart;  Patient's signature needed at discharge.  Patient to Follow up at:  Follow-up Information     [facility] Follow up on 07/18/2024.   Why: You have a hospital follow up appointment for therapy services on  8/28 at 8:30 am.  The appointment will be Virtual telehealth. Contact information: [address]  [address] [place] [address] [phone]         [place] Go to.   Specialty: Professional Counselor Why: You may also go to this provider for an assessment, to receive therapy services, on Monday through Friday from 9 am to 1 pm. Contact information: [address]  [address] [place] [address] [phone]         [facility]. Call on 07/15/2024.   Specialty: Hospice and Palliative Medicine Why: Please call on 07/15/24 at 9:00 am to personally schedule an appointment for grief/bereavement therapy services. Contact information: [address] [facility] [place]  [address] [phone]        Tammy Tari DASEN, PA-C. Call on 07/16/2024.   Specialty: Physician Assistant Why: Please call your primary care provider on 07/16/24 at 9:00 am to schedule a hospital follow up appointment. Contact information: [address] [place] [address] [phone]                 Next level of care provider has access to [facility] Link:no  Safety Planning and Suicide Prevention discussed: Yes,  CSW called and spoke to god mother     Has patient been referred to the Quitline?: Patient  refused referral for treatment  Patient has been referred for addiction treatment: Patient refused referral for treatment.  Kimmora Risenhoover O Kohner Orlick, LCSWA 07/13/2024, 9:20 AM

## 2024-07-13 NOTE — Plan of Care (Signed)
   Problem: Education: Goal: Knowledge of [facility] Education information/materials will improve Outcome: Progressing Goal: Emotional status will improve Outcome: Progressing Goal: Mental status will improve Outcome: Progressing Goal: Verbalization of understanding the information provided will improve Outcome: Progressing

## 2024-07-13 NOTE — Group Note (Signed)
 Date:  07/13/2024 Time:  10:18 AM  Group Topic/Focus:  Goals Group:   The focus of this group is to help patients establish daily goals to achieve during treatment and discuss how the patient can incorporate goal setting into their daily lives to aide in recovery.    Participation Level:  Active  Participation Quality:  Appropriate  Affect:  Appropriate  Cognitive:  Appropriate  Insight: Appropriate  Engagement in Group:  Engaged  Modes of Intervention:  Discussion  Additional Comments:  Pt is also looking forward to leaving today, she is looking forward to spending time with family  Nat Rummer 07/13/2024, 10:18 AM

## 2024-07-13 NOTE — BH Assessment (Signed)
 (  Sleep Hours) - 7.5 (Any PRNs that were needed, meds refused, or side effects to meds)- None (Any disturbances and when (visitation, over night)- None (Concerns raised by the patient)- None (SI/HI/AVH)- Denies

## 2024-07-13 NOTE — Progress Notes (Signed)
 Pt discharged to lobby where family is waiting to take her home. Pt was stable and appreciative at that time. All papers and prescriptions were given to patient and all valuables returned. Verbal understanding of discharge instructions was expressed.Denies SI/HI and A/VH. Pt given opportunity to ask questions.

## 2024-07-13 NOTE — Progress Notes (Signed)
   07/13/24 0800  Psych Admission Type (Psych Patients Only)  Admission Status Voluntary  Psychosocial Assessment  Patient Complaints None  Eye Contact Fair  Facial Expression Animated  Affect Appropriate to circumstance  Speech Logical/coherent  Interaction Assertive  Motor Activity Slow  Appearance/Hygiene Unremarkable  Behavior Characteristics Appropriate to situation  Mood Pleasant  Thought Process  Coherency WDL  Content WDL  Delusions None reported or observed  Perception WDL  Hallucination None reported or observed  Judgment Impaired  Confusion None  Danger to Self  Current suicidal ideation? Denies  Description of Suicide Plan No Plan  Agreement Not to Harm Self Yes  Description of Agreement Verbal  Danger to Others  Danger to Others None reported or observed
